# Patient Record
Sex: Male | Born: 1955 | Race: Black or African American | Hispanic: No | Marital: Single | Smoking: Former smoker
Health system: Southern US, Community
[De-identification: ages and names within clinical notes are randomized; demographics above are authoritative.]

## PROBLEM LIST (undated history)

## (undated) ENCOUNTER — Emergency Department (HOSPITAL_COMMUNITY): Discharge status: Absent without leave | Payer: Medicare Other

## (undated) DIAGNOSIS — C229 Malignant neoplasm of liver, not specified as primary or secondary: Secondary | ICD-10-CM

## (undated) DIAGNOSIS — I10 Essential (primary) hypertension: Secondary | ICD-10-CM

## (undated) DIAGNOSIS — C801 Malignant (primary) neoplasm, unspecified: Secondary | ICD-10-CM

## (undated) DIAGNOSIS — IMO0002 Reserved for concepts with insufficient information to code with codable children: Secondary | ICD-10-CM

## (undated) DIAGNOSIS — E785 Hyperlipidemia, unspecified: Secondary | ICD-10-CM

## (undated) DIAGNOSIS — C099 Malignant neoplasm of tonsil, unspecified: Secondary | ICD-10-CM

## (undated) HISTORY — PX: OTHER SURGICAL HISTORY: SHX169

## (undated) HISTORY — PX: CHOLECYSTECTOMY: SHX55

---

## 2000-08-03 ENCOUNTER — Encounter: Payer: Self-pay | Admitting: Internal Medicine

## 2000-08-03 ENCOUNTER — Ambulatory Visit (HOSPITAL_COMMUNITY): Admission: RE | Admit: 2000-08-03 | Discharge: 2000-08-03 | Payer: Self-pay | Admitting: Internal Medicine

## 2000-12-05 ENCOUNTER — Emergency Department (HOSPITAL_COMMUNITY): Admission: EM | Admit: 2000-12-05 | Discharge: 2000-12-05 | Payer: Self-pay | Admitting: Emergency Medicine

## 2001-05-30 ENCOUNTER — Ambulatory Visit (HOSPITAL_COMMUNITY): Admission: RE | Admit: 2001-05-30 | Discharge: 2001-05-30 | Payer: Self-pay | Admitting: Internal Medicine

## 2001-05-30 ENCOUNTER — Encounter: Payer: Self-pay | Admitting: Internal Medicine

## 2001-07-06 ENCOUNTER — Ambulatory Visit (HOSPITAL_COMMUNITY): Admission: RE | Admit: 2001-07-06 | Discharge: 2001-07-06 | Payer: Self-pay | Admitting: Internal Medicine

## 2001-07-06 ENCOUNTER — Encounter: Payer: Self-pay | Admitting: Internal Medicine

## 2001-09-30 ENCOUNTER — Emergency Department (HOSPITAL_COMMUNITY): Admission: EM | Admit: 2001-09-30 | Discharge: 2001-09-30 | Payer: Self-pay | Admitting: *Deleted

## 2001-12-16 ENCOUNTER — Emergency Department (HOSPITAL_COMMUNITY): Admission: EM | Admit: 2001-12-16 | Discharge: 2001-12-16 | Payer: Self-pay | Admitting: Emergency Medicine

## 2001-12-17 ENCOUNTER — Inpatient Hospital Stay (HOSPITAL_COMMUNITY): Admission: EM | Admit: 2001-12-17 | Discharge: 2001-12-22 | Payer: Self-pay | Admitting: Emergency Medicine

## 2001-12-17 ENCOUNTER — Encounter: Payer: Self-pay | Admitting: Emergency Medicine

## 2002-11-08 ENCOUNTER — Emergency Department (HOSPITAL_COMMUNITY): Admission: EM | Admit: 2002-11-08 | Discharge: 2002-11-08 | Payer: Self-pay | Admitting: Emergency Medicine

## 2002-11-08 ENCOUNTER — Encounter: Payer: Self-pay | Admitting: Emergency Medicine

## 2003-01-08 ENCOUNTER — Ambulatory Visit (HOSPITAL_COMMUNITY): Admission: RE | Admit: 2003-01-08 | Discharge: 2003-01-08 | Payer: Self-pay | Admitting: Internal Medicine

## 2003-02-26 ENCOUNTER — Ambulatory Visit (HOSPITAL_COMMUNITY): Admission: RE | Admit: 2003-02-26 | Discharge: 2003-02-26 | Payer: Self-pay | Admitting: Family Medicine

## 2003-07-07 ENCOUNTER — Emergency Department (HOSPITAL_COMMUNITY): Admission: EM | Admit: 2003-07-07 | Discharge: 2003-07-07 | Payer: Self-pay | Admitting: Emergency Medicine

## 2003-07-30 ENCOUNTER — Emergency Department (HOSPITAL_COMMUNITY): Admission: EM | Admit: 2003-07-30 | Discharge: 2003-07-30 | Payer: Self-pay | Admitting: Emergency Medicine

## 2003-08-12 ENCOUNTER — Emergency Department (HOSPITAL_COMMUNITY): Admission: EM | Admit: 2003-08-12 | Discharge: 2003-08-12 | Payer: Self-pay | Admitting: Emergency Medicine

## 2004-07-29 ENCOUNTER — Ambulatory Visit (HOSPITAL_COMMUNITY): Admission: RE | Admit: 2004-07-29 | Discharge: 2004-07-29 | Payer: Self-pay | Admitting: General Surgery

## 2004-08-05 ENCOUNTER — Encounter (INDEPENDENT_AMBULATORY_CARE_PROVIDER_SITE_OTHER): Payer: Self-pay | Admitting: General Surgery

## 2004-08-05 ENCOUNTER — Ambulatory Visit (HOSPITAL_COMMUNITY): Admission: RE | Admit: 2004-08-05 | Discharge: 2004-08-05 | Payer: Self-pay | Admitting: General Surgery

## 2004-12-20 ENCOUNTER — Emergency Department (HOSPITAL_COMMUNITY): Admission: EM | Admit: 2004-12-20 | Discharge: 2004-12-20 | Payer: Self-pay | Admitting: Emergency Medicine

## 2005-04-11 ENCOUNTER — Emergency Department (HOSPITAL_COMMUNITY): Admission: EM | Admit: 2005-04-11 | Discharge: 2005-04-11 | Payer: Self-pay | Admitting: Emergency Medicine

## 2005-10-26 ENCOUNTER — Emergency Department (HOSPITAL_COMMUNITY): Admission: EM | Admit: 2005-10-26 | Discharge: 2005-10-26 | Payer: Self-pay | Admitting: Emergency Medicine

## 2005-12-22 ENCOUNTER — Emergency Department (HOSPITAL_COMMUNITY): Admission: EM | Admit: 2005-12-22 | Discharge: 2005-12-22 | Payer: Self-pay | Admitting: Emergency Medicine

## 2006-01-03 ENCOUNTER — Ambulatory Visit (HOSPITAL_COMMUNITY): Admission: RE | Admit: 2006-01-03 | Discharge: 2006-01-03 | Payer: Self-pay | Admitting: Internal Medicine

## 2006-01-05 ENCOUNTER — Emergency Department (HOSPITAL_COMMUNITY): Admission: EM | Admit: 2006-01-05 | Discharge: 2006-01-05 | Payer: Self-pay | Admitting: Emergency Medicine

## 2006-01-17 ENCOUNTER — Encounter (INDEPENDENT_AMBULATORY_CARE_PROVIDER_SITE_OTHER): Payer: Self-pay | Admitting: Specialist

## 2006-01-17 ENCOUNTER — Ambulatory Visit (HOSPITAL_COMMUNITY): Admission: RE | Admit: 2006-01-17 | Discharge: 2006-01-17 | Payer: Self-pay | Admitting: General Surgery

## 2006-03-26 ENCOUNTER — Emergency Department (HOSPITAL_COMMUNITY): Admission: EM | Admit: 2006-03-26 | Discharge: 2006-03-26 | Payer: Self-pay | Admitting: Emergency Medicine

## 2007-01-19 DIAGNOSIS — IMO0001 Reserved for inherently not codable concepts without codable children: Secondary | ICD-10-CM

## 2007-01-19 HISTORY — DX: Reserved for inherently not codable concepts without codable children: IMO0001

## 2007-02-25 ENCOUNTER — Emergency Department (HOSPITAL_COMMUNITY): Admission: EM | Admit: 2007-02-25 | Discharge: 2007-02-25 | Payer: Self-pay | Admitting: Emergency Medicine

## 2007-03-29 ENCOUNTER — Ambulatory Visit (HOSPITAL_COMMUNITY): Admission: RE | Admit: 2007-03-29 | Discharge: 2007-03-29 | Payer: Self-pay | Admitting: Otolaryngology

## 2007-03-30 ENCOUNTER — Ambulatory Visit (HOSPITAL_COMMUNITY): Admission: RE | Admit: 2007-03-30 | Discharge: 2007-03-30 | Payer: Self-pay | Admitting: Otolaryngology

## 2007-04-06 ENCOUNTER — Encounter (INDEPENDENT_AMBULATORY_CARE_PROVIDER_SITE_OTHER): Payer: Self-pay | Admitting: Otolaryngology

## 2007-04-06 ENCOUNTER — Ambulatory Visit (HOSPITAL_COMMUNITY): Admission: RE | Admit: 2007-04-06 | Discharge: 2007-04-06 | Payer: Self-pay | Admitting: Otolaryngology

## 2007-04-27 ENCOUNTER — Ambulatory Visit (HOSPITAL_COMMUNITY): Admission: RE | Admit: 2007-04-27 | Discharge: 2007-04-27 | Payer: Self-pay | Admitting: Internal Medicine

## 2007-05-03 ENCOUNTER — Encounter: Admission: AD | Admit: 2007-05-03 | Discharge: 2007-05-03 | Payer: Self-pay | Admitting: Dentistry

## 2007-05-03 ENCOUNTER — Ambulatory Visit: Payer: Self-pay | Admitting: Dentistry

## 2007-05-04 ENCOUNTER — Ambulatory Visit: Admission: RE | Admit: 2007-05-04 | Discharge: 2007-08-02 | Payer: Self-pay | Admitting: Radiation Oncology

## 2007-05-12 ENCOUNTER — Ambulatory Visit (HOSPITAL_COMMUNITY): Admission: RE | Admit: 2007-05-12 | Discharge: 2007-05-12 | Payer: Self-pay | Admitting: General Surgery

## 2007-06-17 ENCOUNTER — Emergency Department (HOSPITAL_COMMUNITY): Admission: EM | Admit: 2007-06-17 | Discharge: 2007-06-17 | Payer: Self-pay | Admitting: Emergency Medicine

## 2007-07-15 ENCOUNTER — Inpatient Hospital Stay (HOSPITAL_COMMUNITY): Admission: EM | Admit: 2007-07-15 | Discharge: 2007-08-01 | Payer: Self-pay | Admitting: Emergency Medicine

## 2007-07-21 ENCOUNTER — Ambulatory Visit: Payer: Self-pay | Admitting: Oncology

## 2007-08-01 ENCOUNTER — Ambulatory Visit (HOSPITAL_COMMUNITY): Payer: Self-pay | Admitting: Oncology

## 2007-10-16 ENCOUNTER — Ambulatory Visit (HOSPITAL_COMMUNITY): Admission: RE | Admit: 2007-10-16 | Discharge: 2007-10-16 | Payer: Self-pay | Admitting: Radiation Oncology

## 2008-05-17 ENCOUNTER — Ambulatory Visit (HOSPITAL_COMMUNITY): Admission: RE | Admit: 2008-05-17 | Discharge: 2008-05-17 | Payer: Self-pay | Admitting: Internal Medicine

## 2008-05-27 ENCOUNTER — Ambulatory Visit (HOSPITAL_COMMUNITY): Admission: RE | Admit: 2008-05-27 | Discharge: 2008-05-27 | Payer: Self-pay | Admitting: Internal Medicine

## 2008-07-17 ENCOUNTER — Ambulatory Visit (HOSPITAL_COMMUNITY): Admission: RE | Admit: 2008-07-17 | Discharge: 2008-07-17 | Payer: Self-pay | Admitting: Internal Medicine

## 2009-12-25 ENCOUNTER — Emergency Department (HOSPITAL_COMMUNITY): Admission: EM | Admit: 2009-12-25 | Discharge: 2009-11-02 | Payer: Self-pay | Admitting: Emergency Medicine

## 2010-01-22 ENCOUNTER — Ambulatory Visit (HOSPITAL_COMMUNITY)
Admission: RE | Admit: 2010-01-22 | Discharge: 2010-01-22 | Payer: Self-pay | Source: Home / Self Care | Attending: Otolaryngology | Admitting: Otolaryngology

## 2010-02-09 ENCOUNTER — Encounter (HOSPITAL_COMMUNITY): Payer: Self-pay | Admitting: Internal Medicine

## 2010-02-14 IMAGING — CR DG ABDOMEN 1V
2 series · 2 of 2 positions shown · non-contrast
Comparison: 11/08/2002

CLINICAL DATA: Constipation, abdominal pain

ABDOMEN - 1 VIEW

[view not recorded (1 of 2)]
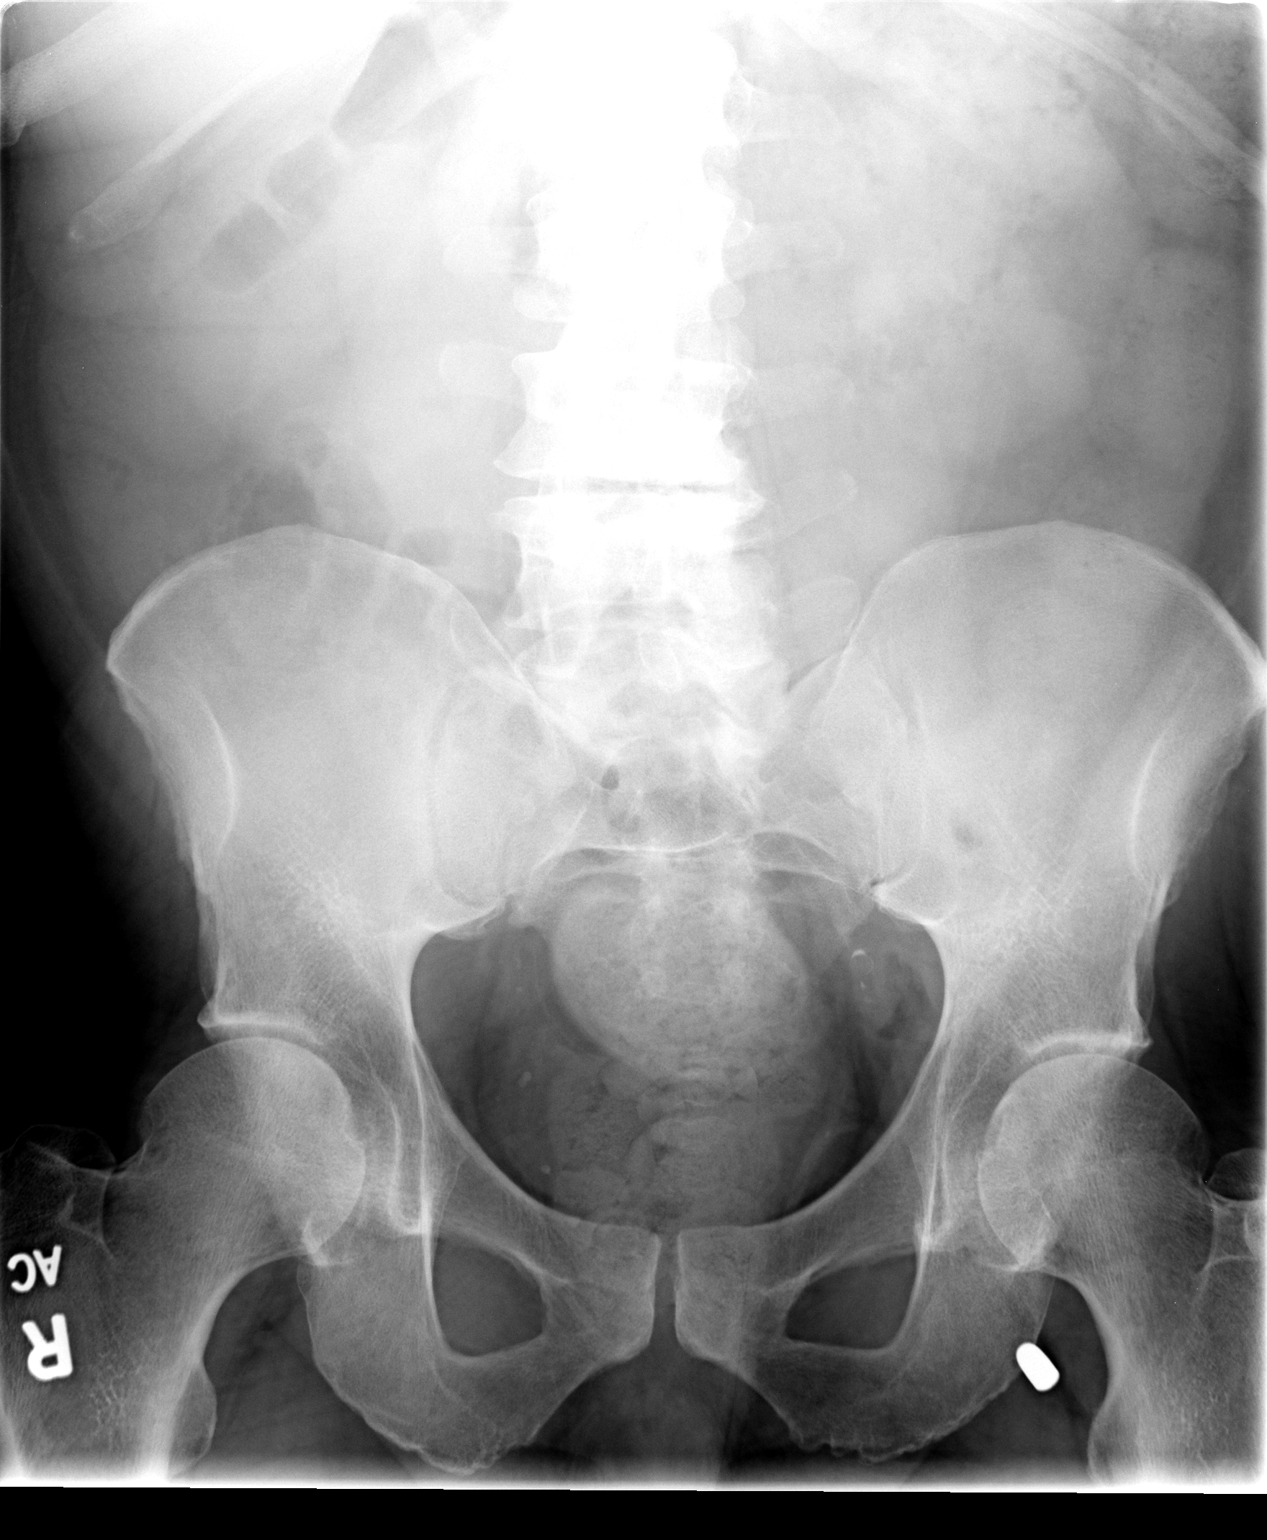

[view not recorded (2 of 2)]
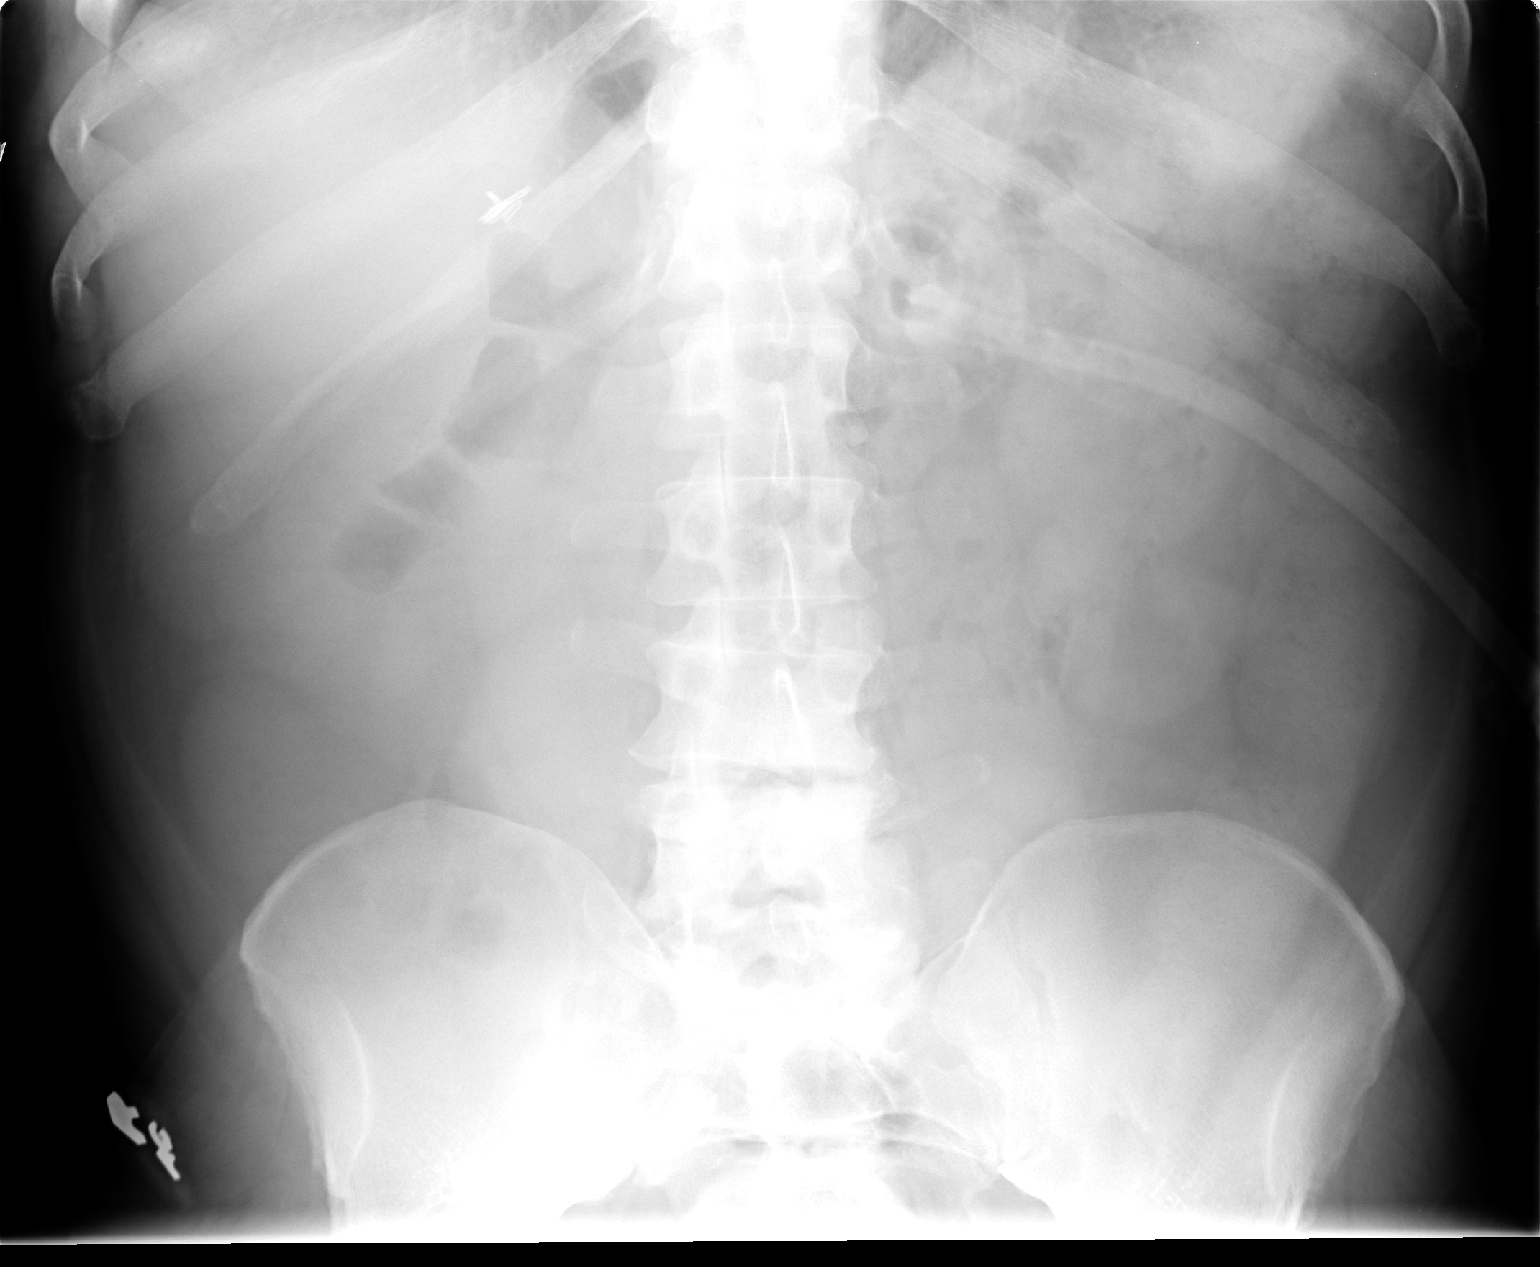

[2 of 2 positions shown; findings below may reference images not displayed]

FINDINGS: Gastrostomy tube is in place.  The patient is status post
cholecystectomy.  There is a nonobstructive bowel gas pattern.
Moderate stool throughout the colon.  No supine evidence of free
air, organomegaly, or suspicious calcification.
IMPRESSION: Moderate stool burden.  No obstruction or acute finding.

Prior cholecystectomy.

## 2010-02-27 NOTE — H&P (Signed)
  NAMECYLE, [name]              ACCOUNT NO.:  [id number]  MEDICAL RECORD NO.:  [id number]          PATIENT TYPE:  OUT  LOCATION:  RAD                           FACILITY:  [facility]  PHYSICIAN:  Dalia Heading, M.D.  DATE OF BIRTH:  08/10/1955  DATE OF ADMISSION:  01/22/2010 DATE OF DISCHARGE:  01/05/2012LH                             HISTORY & PHYSICAL   CHIEF COMPLAINT:  Tonsillar carcinoma, finished with chemotherapy.  HISTORY OF PRESENT ILLNESS:  The patient is a 55 year old black male who is referred for Port-A-Cath removal.  He was being treated for tonsillar carcinoma and has finished with his chemotherapy.  PAST MEDICAL HISTORY:  As noted above, COPD.  PAST SURGICAL HISTORY:  Port-A-Cath placement in 2009, laparoscopic cholecystectomy.  CURRENT MEDICATIONS:  Spiriva inhaler p.r.n.  ALLERGIES:  No known drug allergies.  REVIEW OF SYSTEMS:  The patient does have a history of tobacco and alcohol use.  Denies any recent chest pain, MI, CVA, diabetes mellitus, or bleeding disorders.  FAMILY MEDICAL HISTORY:  Noncontributory.  PHYSICAL EXAMINATION:  GENERAL:  The patient is a well-developed, well- nourished black male, in no acute distress. HEENT:  Unremarkable. LUNGS:  Clear to auscultation with equal breath sounds bilaterally.  A Port-A-Cath is in place in the left upper chest. HEART:  Regular rate and rhythm without S3, S4, or murmurs.  IMPRESSION:  Tonsillar carcinoma, finished with chemotherapy.  PLAN:  The patient is scheduled for a Port-A-Cath removal on March 02, 2010.  The risks and benefits of the procedure were fully explained to the patient, gave informed consent.     Dalia Heading, M.D.     MAJ/MEDQ  D:  02/24/2010  T:  02/25/2010  Job:  [id number]  cc:   Tesfaye D. Felecia Shelling, MD Fax: [phone]  Boris M. Darovsky, M.D. Fax: [phone]  Electronically Signed by Franky Macho M.D. on 02/27/2010 01:24:25 PM

## 2010-03-02 ENCOUNTER — Ambulatory Visit (HOSPITAL_COMMUNITY)
Admission: RE | Admit: 2010-03-02 | Discharge: 2010-03-02 | Disposition: A | Discharge status: Home / Self Care | Payer: Medicare Other | Source: Ambulatory Visit | Attending: General Surgery | Admitting: General Surgery

## 2010-03-02 DIAGNOSIS — C099 Malignant neoplasm of tonsil, unspecified: Secondary | ICD-10-CM | POA: Insufficient documentation

## 2010-03-02 DIAGNOSIS — Z452 Encounter for adjustment and management of vascular access device: Secondary | ICD-10-CM | POA: Insufficient documentation

## 2010-03-05 NOTE — Op Note (Signed)
  [name], [name]              ACCOUNT NO.:  [id number]  MEDICAL RECORD NO.:  [id number]           PATIENT TYPE:  O  LOCATION:  DAYP                          FACILITY:  [facility]  PHYSICIAN:  Dalia Heading, M.D.  DATE OF BIRTH:  May 15, 1955  DATE OF PROCEDURE:  03/02/2010 DATE OF DISCHARGE:                              OPERATIVE REPORT   PREOPERATIVE DIAGNOSIS:  Tonsillar carcinoma, finished with chemotherapy.  POSTOPERATIVE DIAGNOSIS:  Tonsillar carcinoma, finished with chemotherapy.  PROCEDURE:  Port-A-Cath removal.  SURGEON:  Dalia Heading, MD  ANESTHESIA:  Local.  INDICATIONS:  The patient is a 56 year old black male who underwent chemotherapy for tonsillar carcinoma.  The Port-A-Cath has been placed for about 2 years and they are finished.  The patient now comes to the minor procedure room for Port-A-Cath removal.  The risks and benefits of the procedure including bleeding and infection were fully explained to the patient, gave informed consent.  PROCEDURE NOTE:  The patient was placed in the supine position.  The left upper chest was prepped and draped using the usual sterile technique with DuraPrep.  Surgical site confirmation was performed.  A 1% Xylocaine was used for local anesthesia.  An incision was made through the patient's previous surgical scar.  The dissection was taken down to the port.  The port was removed without difficulty.  It was disposed.  No abnormal bleeding was noted.  The subcutaneous layer was reapproximated using a 4-0 Vicryl interrupted suture.  The skin was closed using a 4-0 Vicryl subcuticular suture. OcuSeal was then applied.  All tape and needle counts were correct at the end of the procedure. The patient was discharged in stable condition.  COMPLICATIONS:  None.  SPECIMEN:  None.  ESTIMATED BLOOD LOSS:  Minimal.     Dalia Heading, M.D.     MAJ/MEDQ  D:  03/02/2010  T:  03/03/2010  Job:  [id number]  cc:   Lebron Conners.  Darovsky, M.D. Fax: [phone]  Electronically Signed by Franky Macho M.D. on 03/05/2010 12:37:50 PM

## 2010-04-27 LAB — GLUCOSE, CAPILLARY: Glucose-Capillary: 117 mg/dL — ABNORMAL HIGH (ref 70–99)

## 2010-06-02 NOTE — Op Note (Signed)
NAMESALVADOR, [name]              ACCOUNT NO.:  [id number]   MEDICAL RECORD NO.:  [id number]          PATIENT TYPE:  AMB   LOCATION:  SDS                          FACILITY:  [facility]   PHYSICIAN:  [name], MD     DATE OF BIRTH:  1956/01/03   DATE OF PROCEDURE:  04/06/2007  DATE OF DISCHARGE:                               OPERATIVE REPORT   REFERRING PHYSICIAN:  [name], MD.   PREOP DIAGNOSIS:  Right pharyngeal mass.   POSTOP DIAGNOSIS:  Right pharyngeal mass.   PROCEDURE:  Direct laryngoscopy with biopsy of right pharyngeal mass and  esophagoscopy.   SURGEON:  Pollyann Kennedy.   General endotracheal anesthesia was used.  No complications.   FINDINGS:  Exophytic mass involving the right posterior tonsillar fossa  measuring approximately 4-5 cm in diameter, not involving the soft  palate or the nasopharynx and not involving the hypopharynx.  Biopsies  were sent for pathological evaluation.  No other abnormalities were  identified in the larynx or the esophagus.   HISTORY:  This is a 55 year old with a several month history of right-  sided sore throat.  Risks, benefits, alternatives, complications to the  procedure were explained to the patient who seemed to understand and  agreed to surgery.   PROCEDURE:  The patient was taken to the operating room and placed on  the operating table in the supine position.  Following induction of  general endotracheal anesthesia the table was turned and the patient was  draped in a standard fashion.  1. Esophagoscopy.  A rigid cervical esophagoscope was used to examine      the cervical esophagus.  It was passed through the cricopharyngeus      into the esophagus to the limits of the hub and was slowly      withdrawn while circumferentially inspecting the esophageal wall      and suctioning secretions.  No lesions were identified.  The scope      was then removed.  2. Direct laryngoscopy with biopsy.  The anterior commissure of the  laryngoscope was used to examine the hypopharynx and larynx.  The      complete laryngeal and hypopharyngeal exam was unremarkable.  The      above-      mentioned findings were noted in the oropharynx.  Multiple biopsies      were taken and sent for pathological evaluation.  A 4 x 4 with      saline was applied topically to provide hemostasis.  The patient      was then awakened, extubated and transferred to Recovery in stable      condition.      [name], MD  Electronically Signed     JHR/MEDQ  D:  04/06/2007  T:  04/06/2007  Job:  [id number]   cc:   Tesfaye D. Felecia Shelling, MD

## 2010-06-02 NOTE — Discharge Summary (Signed)
[name], [name]              ACCOUNT NO.:  [id number]   MEDICAL RECORD NO.:  [id number]          PATIENT TYPE:  INP   LOCATION:  A304                          FACILITY:  [facility]   PHYSICIAN:  Tesfaye D. Felecia Shelling, MD   DATE OF BIRTH:  1955/04/10   DATE OF ADMISSION:  07/15/2007  DATE OF DISCHARGE:  07/14/2009LH                               DISCHARGE SUMMARY   DISCHARGE DIAGNOSES:  1. Aspiration pneumonia.  2. Chronic obstructive pulmonary disease.  3. Dehydration.  4. Protein calorie malnutrition.  5. History of carcinoma of the tonsil and status post radiation and      chemotherapy.  6. Pancytopenia.  7. History of peripheral neuropathy.  8. History of tobacco abuse.  9. Osteoarthritis.  10.Status post percutaneous endoscopic gastrostomy tube placement.   DISCHARGE MEDICATIONS:  1. Folic acid 1 mg daily.  2. Lortab 5/500 1 tablet q.6 h.  3. Neurontin 300 mg t.i.d.  4. Omeprazole 40 mg daily.  5. Spiriva inhaler daily.   DISPOSITION:  The patient was discharged home.   HOSPITAL COURSE:  This is a 54 year old male patient with history of  chronic obstructive pulmonary disease and CA of the tonsil, brought to  emergency room with generalized weakness and shortness of breath.  The  patient was recently started on chemotherapy and radiation.  He was  having difficulty to swallow.  He underwent PEG placement.  However, he  was trying to swallow some.  During the evaluation in the emergency  room, patient was found to have a sign of dehydration and a possible  aspiration pneumonia.  He was started on IV antibiotics and the IV  fluids.  He was admitted and continued on tube feeding.  While the  patient was in the hospital, he started  developing pancytopenia.  Oncology consult was done and the patient was  closely monitored.  He was on neutropenic precaution.  Later, the  patient was given a dose of Neulasta to stimulate his bone marrow.  The  patient became stable and was  discharged to follow his treatment in  outpatient.      Tesfaye D. Felecia Shelling, MD  Electronically Signed     TDF/MEDQ  D:  08/14/2007  T:  08/14/2007  Job:  [id number]

## 2010-06-02 NOTE — Op Note (Signed)
[name], [name]              ACCOUNT NO.:  [id number]   MEDICAL RECORD NO.:  [id number]          PATIENT TYPE:  OUT   LOCATION:                                 FACILITY:   PHYSICIAN:  Charlynne Pander, D.D.S.DATE OF BIRTH:  Jun 01, 1955   DATE OF PROCEDURE:  05/03/2007  DATE OF DISCHARGE:                               OPERATIVE REPORT   PREOPERATIVE DIAGNOSES:  1. 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  2. Prechemoradiation therapy dental protocol.  3. Retained root tip in the area of tooth #32.   POSTOPERATIVE DIAGNOSES:  1. 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  2. Prechemoradiation therapy dental protocol.  3. Retained root tip in the area of tooth #32.   OPERATION:  Surgical extraction of retained root tip in the area of  tooth #32 with alveoloplasty.   SURGEON:  Charlynne Pander, D.D.S.   ASSISTANT:  Public house manager (Sales executive).   ANESTHESIA:  Local anesthesia only.   MEDICATION.:  Local anesthesia with total utilization of 1 carpule  containing 36 mg of Xylocaine with 0.018 mg of epinephrine.   SPECIMENS:  There was 1 retained root tip from tooth area #32.   DRAINS:  None.   CULTURES:  None.   COMPLICATIONS:  None.   ESTIMATED BLOOD LOSS:  Less than 10 mL.   FLUIDS:  None.   INDICATIONS:  The patient was recently diagnosed with squamous cell  carcinoma of the right oropharynx/tonsillar fossa.  Patient with  anticipated chemoradiation therapy.  The patient was seen as part of a  preradiation therapy dental protocol evaluation.  The patient was  examined and treatment planned for extraction of retained root tip in  the area of tooth #32 with alveoloplasty as indicated.  This treatment  plan was formulated to decrease the risk and complications associated  with dental infection from affecting the patient's systemic health as  well as to prevent future complication of osteoradionecrosis.   OPERATIVE FINDINGS:  The patient was examined in the dental medicine  clinic.  Retained root tip #32 was identified for extraction.  The  patient is noted to be affected by the retained root tip as mentioned.  This necessitated removal of the retained root tip with alveoloplasty as  indicated.   DESCRIPTION OF PROCEDURE:  The patient presented to the dental medicine  clinic.  All risks, benefits and complications were explained to the  patient and an informed consent was attained.  The patient was then  prepped and draped in the usual manner for a dental medicine procedure.  The patient was then identified and procedures verified.  At this point  in time local anesthesia was administered with a total utilization of 36  mg of Xylocaine with 0.018 mg of epinephrine.   The mandibular right quadrant was first approached, a 15-blade incision  was made from the distal of #32 and extended to the area of #29.  A  surgical flap was then carefully reflected.  The retained root in the  area of #32 was then identified and removed with the rongeur.  The  area  was further curetted with a Public house manager.  A periapical radiograph  was then obtained and no further root tip was noted.  Further  alveoloplasty was then performed utilizing rongeurs and bone file.  The  surgical site was then irrigated with copious amounts of sterile saline.  Tissues were approximated and trimmed appropriately.  The surgical site  was then closed from the distal of #32 and extended to the mesial of #29  utilizing 3-0 chromic gut suture in a continuous interrupted suture  technique x1.  A series of 4x4 gauze were placed in the mouth to aid  hemostasis.  The patient was then placed in an upright position.  Postoperative blood pressure was obtained with the findings of 129/64  with a pulse of 69.  The patient was then given postop instructions  written and verbally.  The patient does have pain medication to use at  home.  The  patient will return in approximately 1 week for evaluation  for suture removal.  The patient to follow up with Dr. Antony Blackbird for  simulation and postoperative radiation therapy to commence approximately  2 weeks from this date.  All counts were correct for the dental medicine  procedure.  The patient was then dismissed to the care of his brother in  a stable condition.      Charlynne Pander, D.D.S.  Electronically Signed     RFK/MEDQ  D:  05/03/2007  T:  05/03/2007  Job:  [id number]   cc:   Billie Lade, M.D.  Fax: [phone]   Boris M. Darovsky, M.D.  Fax: [phone]   Charlynne Pander, D.D.S.  Fax: [phone]

## 2010-06-02 NOTE — Op Note (Signed)
NAMETYREE, FLUHARTY              ACCOUNT NO.:  [id number]   MEDICAL RECORD NO.:  [id number]          PATIENT TYPE:  AMB   LOCATION:  DAY                           FACILITY:  [facility]   PHYSICIAN:  Dalia Heading, M.D.  DATE OF BIRTH:  22-Jul-1955   DATE OF PROCEDURE:  05/12/2007  DATE OF DISCHARGE:                               OPERATIVE REPORT   PREOPERATIVE DIAGNOSIS:  Tonsillar carcinoma, need for central venous  access.   POSTOPERATIVE DIAGNOSIS:  Tonsillar carcinoma, need for central venous  access.   PROCEDURE:  Percutaneous endoscopic gastrostomy tube, Port-A-Cath  insertion.   SURGEON:  Dalia Heading, MD   ANESTHESIA:  General endotracheal.   INDICATIONS:  The patient is a 55 year old black male recently diagnosed  with tonsillar carcinoma who is about to undergo radiation and  chemotherapy.  He will need a PEG for dysphagia as well as a Port-A-Cath  for chemotherapy.  The risks and benefits of both procedures including  bleeding, infection, bowel injury, and the possibility of a pneumothorax  were fully explained to the patient, he gave informed consent.   PROCEDURE NOTE:  The patient was placed in supine position.  After  induction of general endotracheal anesthesia, the abdomen was prepped  and draped using the usual sterile technique with Betadine.  Surgical  site confirmation was performed.   An EGD was performed down to the pylorus.  The pylorus was noted to be  widely patent.  A site in the epigastric region was transilluminated and  palpated.  A small incision was made and the needle was advanced into  the stomach under direct visualization without difficulty.  Guidewire  was then advanced into the stomach and grasped with a snare.  The  endoscope was then removed with the wire.  A gastrostomy tube was  attached to the wire and using the pull technique, the gastrostomy tube  was pulled through the abdominal wall and the bulb was fixated to the  lumen of the  stomach.  The bolster was placed at the 3 cm mark without  difficulty.  The endoscope was advanced back down into the stomach and  the bulb was noted to be in appropriate position.  All air was then  evacuated from the stomach prior to removal of the endoscope.  Bacitracin ointment and dry sterile dressing was then applied to the PEG  site.   Next, we proceeded with a Port-A-Cath.  The left upper chest was prepped  and draped using the usual sterile technique with Betadine.  Surgical  site confirmation was performed.  A transverse incision was made below  the left clavicle.  The subcutaneous pocket was then formed.  A needle  was advanced into the left subclavian vein using the Seldinger technique  without difficulty.  Guidewire was then advanced into the right atrium  under fluoroscopic guidance.  An introducer and peel-away sheath were  placed over the guidewire.  The catheter was inserted through the peel-  away sheath and the peel-away sheath was removed.  The catheter was  attached to the port, the port placed in subcutaneous  pocket.  Adequate  positioning was confirmed by fluoroscopy.  The port was flushed with  3000 units of heparin.  The subcutaneous layer was re-approximated using  a 3-0 Vicryl interrupted suture.  The skin was closed using a 4-0 Vicryl  subcuticular suture.  Lidocaine was instilled in the surrounding region.  Dermabond was then applied.   All tape and needle counts were correct at the end of both procedures.  The patient was extubated in the operating room, went back to recovery  room, and awake in stable condition.   COMPLICATIONS:  None.   SPECIMENS:  None.   ESTIMATED BLOOD LOSS:  Minimal.      Dalia Heading, M.D.  Electronically Signed     MAJ/MEDQ  D:  05/12/2007  T:  05/13/2007  Job:  [id number]   cc:   Tesfaye D. Felecia Shelling, MD  Fax: [phone]   Shawnie Dapper, M.D.  Fax: [phone]   Boris M. Darovsky, M.D.  Fax: [phone]

## 2010-06-02 NOTE — H&P (Signed)
[name], [name]              ACCOUNT NO.:  [id number]   MEDICAL RECORD NO.:  [id number]          PATIENT TYPE:  AMB   LOCATION:  DAY                           FACILITY:  [facility]   PHYSICIAN:  Dalia Heading, M.D.  DATE OF BIRTH:  1955/04/07   DATE OF ADMISSION:  DATE OF DISCHARGE:  LH                              HISTORY & PHYSICAL   CHIEF COMPLAINT:  Tonsillar carcinoma.   HISTORY OF PRESENT ILLNESS:  The patient is a 55 year old black male who  is referred for a PEG placement as well as Port-A-Cath placement.  He  has tonsillar carcinoma and is about to undergo radiation and  chemotherapy treatments.  It is suspected that he will have difficulty  with eating once his radiation starts to the neck in tonsillar region.   PAST MEDICAL HISTORY:  Includes COPD.   PAST SURGICAL HISTORY:  Laparoscopic cholecystectomy.   CURRENT MEDICATIONS:  Spiriva inhaler and hydrocodone for pain.   ALLERGIES:  No known drug allergies.   REVIEW OF SYSTEMS:  The patient does drink daily as well as smoke daily.  Denies any recent MI, chest pain, CVA, diabetes mellitus, or bleeding  disorders.   PHYSICAL EXAMINATION:  GENERAL:  The patient is a well-developed, well-  nourished black male in no acute distress.  HEENT:  No obvious obstruction of the oropharynx.  LUNGS:  Clear to auscultation with equal breath sounds bilaterally.  HEART:  Regular rate and rhythm without S3, S4, murmurs.  ABDOMEN:  Soft, nontender, and nondistended.  No hepatosplenomegaly,  masses, or hernias identified.   IMPRESSION:  Tonsillar carcinoma.   PLAN:  The patient is scheduled for PEG and Port-A-Cath insertions on  May 12, 2007.  The risks and benefits of the procedures including  bleeding, infection, bowel injury, and the possibility of a pneumothorax  were fully explained to the patient, gained informed consent.      Dalia Heading, M.D.  Electronically Signed     MAJ/MEDQ  D:  05/09/2007  T:   05/10/2007  Job:  [id number]   cc:   Tesfaye D. Felecia Shelling, MD  Fax: [phone]   Shawnie Dapper, M.D.  Fax: [phone]   Boris M. Darovsky, M.D.  Fax: [phone]

## 2010-06-02 NOTE — Consult Note (Signed)
[name], [name]              ACCOUNT NO.:  [id number]   MEDICAL RECORD NO.:  [id number]          PATIENT TYPE:  INP   LOCATION:  A304                          FACILITY:  [facility]   PHYSICIAN:  Albert Horns. Neijstrom, MD  DATE OF BIRTH:  [birth year]   DATE OF CONSULTATION:  07/15/2007  DATE OF DISCHARGE:                                 CONSULTATION   DIAGNOSES:  1. Weakness.  2. Shortness of breath.  3. At times difficulty swallowing.  4. Aching in his legs.  5. Leukopenia.  6. Anemia.  7. Head and neck cancer status post radiation and chemotherapy just      have not finished yet 1 week ago today he states.  8. Chronic obstructive pulmonary disease.  9. History of excessive alcohol use in the past.  10.History of peripheral neuropathy most likely causing his symptoms      as mentioned above.  11.Osteoarthritis.   HISTORY:  This is a 51-year African American gentleman admitted with  weakness, fatigue as mentioned above by [name].  He lives at home  alone.  He is not married.  He has no children he states.  He smoked and  drank excessively.  He stopped smoking about 3-4 months ago he states.  Stopped drinking and he thinks sometime within the last 3-6 months.  He  is unsure is to the dates.   He went to see [name] the day due to this amount of weakness and  fatigue.  He was found to have on July 15, 2007, a white count of 16,400  with a left shift.  His hemoglobin on that day was 12.0, platelets  121,000.  A repeat blood counts in next day showed his white count  dropped to 5800, hemoglobin was down to 10.9, platelets 107,000;  however, he had been hydrated.  His lipase on admission was normal.  His  C-MET on admission showed a BUN of 13, creatinine of 0.74.  His albumin  in the resting was 3.4, total protein 7.3, and calcium was normal.   BMET on the following day of admission was showing a BUN of 7,  creatinine 0.63, other electrolytes were fine.  By July 19, 2007, his  white count was down to 2600 with an absolute neutrophil count of only  1500, hemoglobin was 11 and platelets 128,000.  He had a CBC today,  which shows a white count of 2300, again neutrophil count of 1500,  platelets 131,000, and hemoglobin 11.1, so he may indeed be recovering  at this juncture.  Two blood cultures from the July 15, 2007, showed no  growth.  Urine culture shows higher contaminant.   His urinalysis on the July 15, 2007, showed multiple bacteria, but his  urine, leukocytes, and nitrite tests were negative.   He had chest x-ray as low as of some abdominal films on admission, which  showed no acute abnormalities.   The only abnormality seen with some moderate amount of stool in his  colon and prior changes of cholecystectomy.  He does remember Dr.  Lovell [name] did that procedure several years ago.  He did have a PET scan at the time of workup on April 27, 2007, which did  show positive nodes in the neck on the right side, and there was a  questionable node in the supraclavicular fossa as well.  The pharynx  mass was also very hypermetabolic at that time.  The left submandibular  gland also showed a slight hypermetabolic focus, which may be a small  lymph node in that area on the left and again he had right-sided disease  at presentation and right-sided level 2 nodes positive on the PET scan.   The subclavicular lymph node that was mildly hypermetabolic was on the  right.   This gentleman had very advanced cancer of the head and neck obviously  at presentation.   SOCIAL HISTORY:  He is single, never been married, he has no children.  He does have some siblings who look in on him.  His mother is 88, and  she is still alive, but has Alzheimer's he thinks.  His father is  deceased from liver failure, he believes from alcoholism.   This gentleman has an eight grade education, left school to go to work.   He has smoked for number of years, drink heavily at one time.  He  denies  any alcohol use during the radiation and chemotherapy.   PHYSICAL EXAMINATION:  VITAL SIGNS:  He has a normal temperature at this  time.  Blood pressure 124/90, respirations 16 and unlabored, pulse rate  around 72 and regular.  GENERAL:  He is not in any acute distress, but he has changes on his  right neck of desquamation and irritation and mild swelling.  HEENT:  His mouth he can open, but it is with difficulty and some  discomfort.  I cannot appreciate any intraoral lesions.  His teeth are  gone.  There were removed by [name] prior to his therapy.  He  has no obvious axillary nodes.  He has no obvious left neck nodes or  subclavicular nodes.  I did not examine the right due to his tenderness.  LUNGS:  Clear with diminished breath sounds.  HEART:  Regular rhythm and rate without obvious murmur, rub, or gallop.  ABDOMEN:  Soft and nontender.  He does have a PEG feeding tube.  He also  has a Port-A-Cath left upper chest which appears intact.  EXTREMITIES:  He has no peripheral edema, but pulses are absent in his  feet essentially.  His facial symmetry is intact.   He does look weak but again is not acutely in acute distress.   IMPRESSION AND PLAN:  This gentleman just needs more time following his  completion of chemo and radiation therapy for his advanced metastatic  disease to lymph nodes in the neck and subclavicular fossa.  He should  be improved, and he will return to see [name] upon discharge.      Albert Horns. Mariel Sleet, MD  Electronically Signed     ESN/MEDQ  D:  07/21/2007  T:  07/21/2007  Job:  [id number]   cc:   Albert D. Felecia Shelling, MD  Fax: [phone]   [name], M.D.  Fax: [phone]   [name], M.D.  Fax: [phone]

## 2010-06-02 NOTE — H&P (Signed)
NAMEANTWINE, AGOSTO              ACCOUNT NO.:  [id number]   MEDICAL RECORD NO.:  [id number]          PATIENT TYPE:  INP   LOCATION:  A304                          FACILITY:  [facility]   PHYSICIAN:  Tesfaye D. Felecia Shelling, MD   DATE OF BIRTH:  1955/02/19   DATE OF ADMISSION:  07/15/2007  DATE OF DISCHARGE:  LH                              HISTORY & PHYSICAL   CHIEF COMPLAINT:  Generalized weakness and shortness of breath.   HISTORY OF PRESENT ILLNESS:  This is a 55 year old male patient who has  history of chronic obstructive pulmonary disease and CA of the larynx  who was brought to the emergency room with the above complaints.  The  patient has been on chemotherapy and radiation therapy after he had  surgery for CA of the larynx.  The patient has been having difficulty in  swallowing.  He has a PEG tube.  However, he was trying to take oral  feeding.  The patient started having shortness of breath and generalized  weakness.  He was not taking any significant amount of oral feeding.  He  was brought to the emergency room where he was evaluated and was found  to be very weak.  The patient was admitted as a possible case of  dehydration.   REVIEW OF SYSTEMS:  The patient feels weak and tired.  He has difficulty  in swallowing and had episode of choking.  No fever or chills, chest  pain, nausea, vomiting, abdominal pain, dysuria, urgency or frequency of  urination.   PAST MEDICAL HISTORY:  1. Chronic obstructive pulmonary disease.  2. Cancer of the larynx.  3. Osteoarthritis.  4. Peripheral neuropathy.  5. History of alcohol and tobacco abuse.   CURRENT MEDICATIONS:  1. Folic acid 1 mg daily.  2. Lortab 5/500 one tablet q.6 p.r.n.  3. Neurontin 300 mg t.i.d.  4. Spiriva 1 inhaler b.i.d.  5. Ventolin inhaler q.4 h p.r.n.  6. Omeprazole 20 mg daily.   SOCIAL HISTORY:  The patient is single.  He has history of alcohol and  tobacco abuse.  No history of substance abuse.   PHYSICAL  EXAMINATION:  GENERAL:  The patient is alert, awake and  chronically sick looking.  VITAL SIGNS:  On admission blood pressure 126/77, pulse 87, respiratory  rate 18, temperature 98.6 degrees Fahrenheit.  HEENT: Pupils are equal and reactive.  NECK:  Neck is supple.  CHEST:  Poor air entry, bilateral rhonchi.  CARDIOVASCULAR:  First and second heart sound heard.  No murmur, no  gallop.  ABDOMEN:  Soft and lax.  Bowel sounds positive.  No mass or  organomegaly.  EXTREMITIES:  No leg edema.   LABS:  On admission, sodium 137, potassium 3.6, chloride 103, carbon  dioxide 26, glucose 103, BUN 13, creatinine 0.7, calcium 9.8.  CBC - WBC  16.4, hemoglobin 12.0, hematocrit 34, platelet 121.   ASSESSMENT:  This is a 55 year old male patient with history of recent  diagnosis of cancer of the larynx and is status post surgery and  chemotherapy and radiation.  The patient is obviously having difficulty  swallowing.  He was trying to take oral feeding which may have resulted  in some degree of aspiration.  The patient has a generalized weakness  secondary to poor oral intake and possibly dehydration and protein  calorie malnutrition.   PLAN:  We will start the patient on tube feeding.  Will gradually  rehydrate with IV fluids.  Will continue on nebulizer treatments.  Will  empirically treat for possible aspiration pneumonia.      Tesfaye D. Felecia Shelling, MD  Electronically Signed     TDF/MEDQ  D:  07/16/2007  T:  07/16/2007  Job:  [id number]

## 2010-06-05 NOTE — Op Note (Signed)
NAMEMACKENZY, [name]              ACCOUNT NO.:  [id number]   MEDICAL RECORD NO.:  [id number]          PATIENT TYPE:  AMB   LOCATION:  DAY                           FACILITY:  [facility]   PHYSICIAN:  Dalia Heading, M.D.  DATE OF BIRTH:  04/17/1955   DATE OF PROCEDURE:  01/17/2006  DATE OF DISCHARGE:                               OPERATIVE REPORT   PREOPERATIVE DIAGNOSIS:  Cholecystitis, cholelithiasis.   POSTOPERATIVE DIAGNOSIS:  Cholecystitis, cholelithiasis.   PROCEDURE:  Laparoscopic cholecystectomy.   SURGEON:  Dr. Franky Macho.   ANESTHESIA:  General endotracheal.   INDICATIONS:  The patient is a 55 year old black male who presents with  cholecystitis secondary to cholelithiasis.  The risks and benefits of  the procedure, including bleeding, infection, hepatobiliary injury, and  the possibility of an open procedure, were fully explained to the  patient, and he gave informed consent.   PROCEDURE NOTE:  The patient was placed in the supine position.  After  the induction of general endotracheal anesthesia, the abdomen was  prepped and draped using the usual sterile technique with Betadine.  Surgical site confirmation was performed.   A supraumbilical incision was made down to fascia.  A Veress needle was  introduced into the abdominal cavity and confirmation of placement was  done using the saline drop test.  The abdomen was then insufflated to 16  mmHg pressure.  An 11 mm trocar was introduced into the abdominal cavity  under direct visualization without difficulty.  The patient was placed  in reverse Trendelenburg position.  An additional 11 mm trocar was  placed in the epigastric region, and 5 mm trocars were placed in the  right upper quadrant and right flank regions.  The liver was inspected  and noted to be within normal limits.  The gallbladder was retracted  superiorly and laterally.  The dissection was begun around the  infundibulum of the gallbladder.  The cystic  duct was first identified.  The juncture to the infundibulum was fully identified.  Endoclips were  placed proximally and distally on the cystic duct and the cystic duct  was divided.  This was likewise done to the cystic artery.  The  gallbladder then freed away from the gallbladder fossa using Bovie  electrocautery.  The gallbladder was delivered through the epigastric  trocar site using an EndoCatch bag.  The gallbladder fossa was  inspected.  No abnormal bleeding or bile leakage was noted.  Surgicel  was placed in the gallbladder fossa.  All fluid and air were then  evacuated from the abdominal cavity prior to removal of the trocars.   All wounds were irrigated with normal saline.  All wounds were injected  with 0.5 cm Sensorcaine.  The supraumbilical fascia was reapproximated  using an 0-Vicryl interrupted suture.  All skin incisions were closed  using staples.  Betadine ointment and dry sterile dressings were  applied.   All tape and needle counts were correct at the end of the procedure.  The patient was extubated in the operating room and went back to the  recovery room awake in stable condition.  COMPLICATIONS:  None.   SPECIMEN:  Gallbladder stones.   BLOOD LOSS:  Minimal.      Dalia Heading, M.D.  Electronically Signed     MAJ/MEDQ  D:  01/17/2006  T:  01/17/2006  Job:  [id number]   cc:   Tesfaye D. Felecia Shelling, MD  Fax: [phone]

## 2010-06-05 NOTE — H&P (Signed)
[name], [name]              ACCOUNT NO.:  [id number]   MEDICAL RECORD NO.:  [id number]          PATIENT TYPE:  AMB   LOCATION:  DAY                           FACILITY:  [facility]   PHYSICIAN:  Dalia Heading, M.D.  DATE OF BIRTH:  18-Nov-1955   DATE OF ADMISSION:  DATE OF DISCHARGE:  LH                              HISTORY & PHYSICAL   CHIEF COMPLAINT:  Cholecystitis, cholelithiasis.   HISTORY OF PRESENT ILLNESS:  The patient is a 55 year old black male who  is referred for evaluation and treatment of biliary colic, secondary to  cholelithiasis.  He has been having right upper quadrant abdominal pain,  nausea, bloating for many weeks.  He does have fatty food intolerance.  No fever, chills, jaundice have been noted.   PAST MEDICAL HISTORY:  Includes COPD.   PAST SURGICAL HISTORY:  Unremarkable.   CURRENT MEDICATIONS:  1. Spiriva inhaler.  2. Hydrocodone for pain.   ALLERGIES:  NO KNOWN DRUG ALLERGIES.   REVIEW OF SYSTEMS:  The patient does drink several beers a day and also  smokes.  He denies an MI, chest pain, CVA, diabetes mellitus, bleeding  disorders.   PHYSICAL EXAMINATION:  GENERAL:  The patient is a well-developed, well-  nourished black male in no acute distress.  HEENT:  Examination reveals no scleral icterus.  LUNGS:  Clear to auscultation with equal breath sounds bilaterally.  HEART:  Examination reveals regular rate and rhythm without S3, S4,  murmurs.  ABDOMEN:  Soft and nondistended.  He is tender in the right upper  quadrant to palpation.  No hepatosplenomegaly, masses, hernias are  identified.  Ultrasound gallbladder reveals cholelithiasis with a normal  common bile duct.   IMPRESSION:  1. Cholecystitis.  2. Cholelithiasis.   PLAN:  The patient is scheduled for laparoscopic cholecystectomy on  January 17, 2006.  The risks and benefits of the procedure including  bleeding, infection, hepatobiliary, the possibly of an open procedure  were fully  explained to the patient, gave informed consent.      Dalia Heading, M.D.  Electronically Signed     MAJ/MEDQ  D:  01/13/2006  T:  01/13/2006  Job:  [id number]   cc:   Dalia Heading, M.D.  Fax: [phone]

## 2010-06-05 NOTE — Op Note (Signed)
[name], [name]              ACCOUNT NO.:  [id number]   MEDICAL RECORD NO.:  [id number]          PATIENT TYPE:  AMB   LOCATION:  DAY                           FACILITY:  [facility]   PHYSICIAN:  Barbaraann Barthel, M.D. DATE OF BIRTH:  08-08-1955   DATE OF PROCEDURE:  08/05/2004  DATE OF DISCHARGE:                                 OPERATIVE REPORT   PREOPERATIVE DIAGNOSIS:  Penile phimosis.   POSTOPERATIVE DIAGNOSIS:  Penile phimosis.   PROCEDURE:  Circumcision.   SURGEON:  Barbaraann Barthel, M.D.   SPECIMENRebekah Chesterfield.   NOTE:  This is a 55 year old black male who had difficulty cleaning his  penis and had phimosis and he was referred to me by Dr. Felecia Shelling for elective  circumcision.  We discussed complications not limited to but including  bleeding and infection and the need to refrain from sexual activity until  this has healed. Informed consent was obtained.   GROSS OPERATIVE FINDINGS:  Consistent with phimotic penile foreskin.   TECHNIQUE:  The patient was placed in the supine position after adequate  spinal anesthesia.  The area was prepped with Betadine solution and draped  in the usual manner.  A straight clamp was placed on the dorsal aspect of  the penis, we then placed two along the frenulum anteriorly and then removed  the redundant foreskin.  There were small bleeding points that were easily  cauterized with the needle tip cautery device and the mucosal layer and the  skin layer were then approximated with 4-0 Vicryl.  Prior to closure, all  sponge, needle and instrument counts were found to be correct.  The wound  was irrigated with saline and Xeroform and a tubular dressing were applied.  There were no complications and the patient was taken to the recovery room  in satisfactory condition.       WB/MEDQ  D:  08/05/2004  T:  08/05/2004  Job:  [id number]   cc:   Tesfaye D. Felecia Shelling, MD  [address]  [place]  [place] [address]  Fax: [phone]

## 2010-06-05 NOTE — Discharge Summary (Signed)
   NAME:  [name], [name]                        ACCOUNT NO.:  [id number]   MEDICAL RECORD NO.:  [id number]                   PATIENT TYPE:  INP   LOCATION:  A304                                 FACILITY:  [facility]   PHYSICIAN:  Tesfaye D. Felecia Shelling, M.D.              DATE OF BIRTH:  [birth year]   DATE OF ADMISSION:  12/17/2001  DATE OF DISCHARGE:  12/22/2001                                 DISCHARGE SUMMARY   DISCHARGE DIAGNOSES:  1. Cellulitis of the right side of the face.  2. Parotiditis.  3. History of degenerative joint disease.  4. Bronchitis.  5. History of alcoholic liver disease.   DISCHARGE MEDICATIONS:  1. Keflex 500 mg p.o. daily for five days.  2. Neurontin 600 mg p.o. three times daily.  3. Combivent 2 puffs p.o. q.i.d.  4. Hydrocodone 500/5 mg 1 tablet p.o. q.6h. p.r.n.   DISPOSITION:  The patient was discharged to home in stable condition.   HOSPITAL COURSE:  This is a 55 year old male patient who was admitted on  December 17, 2001, due to pain and swelling of his right side of the face  and parotid gland.  The patient was started on IV antibiotics.  Over the  hospital stay he gradually improved.  He was discharged back home on oral  antibiotics to continue his regular medication and finish his antibiotics.                                               Tesfaye D. Felecia Shelling, M.D.    TDF/MEDQ  D:  01/26/2002  T:  01/26/2002  Job:  [id number]

## 2010-06-05 NOTE — H&P (Signed)
NAME:  [name], [name]                        ACCOUNT NO.:  [id number]   MEDICAL RECORD NO.:  [id number]                   PATIENT TYPE:  INP   LOCATION:  A304                                 FACILITY:  [facility]   PHYSICIAN:  Hanley Hays. Dechurch, M.D.           DATE OF BIRTH:  06-29-1955   DATE OF ADMISSION:  12/17/2001  DATE OF DISCHARGE:                                HISTORY & PHYSICAL   HISTORY OF PRESENT ILLNESS:  A 55 year old African-American gentleman  followed by Dr. Felecia Shelling who was seen in the emergency room 12/16/01 with a two  day history of swollen left face.  He was given clindamycin at discharge.  He returned today with worsening swelling and pain, fever of 100.5, and  chills.  Evaluation revealed increase induration anterior to the left ear,  to the cheek, and at the sternocleidomastoid.  Tender and nonfluctuant.  A  CT scan was done in the emergency room which revealed no evidence of abscess  or periorbital involvement.  The patient is being admitted to the hospital  for IV antibiotics and local therapy.   REVIEW OF SYSTEMS:  The patient is disabled, although he is unable to tell  me why except for arthritis.  He denies any history of seizure, though he  is on Neurontin.  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  He is  edentulous.  He recently had an upper respiratory infection, as well as cold  sores on his left upper lip.  He has no GU complaints.  No dyspnea or other  complaints.  He noted a dry cough which is a residual of his cold.  He has  had no shortness of breath or swallowing difficulties.  He has a long  history of constipation apparently and bloating.  Denies any reflux per se.  He takes varying laxatives at home p.r.n.   MEDICATIONS:  1. Neurontin 600 t.i.d.  2. Combivent two puffs q.i.d.  3. He has p.r.n. hydrocodone and Skelaxin, of which he brings prescriptions     which still have multiple pills from October.   ALLERGIES:  Denies any history  of drug allergies.   SOCIAL HISTORY:  He is disabled secondary to arthritis.  He is single.  He  smokes one pack per day.  No alcohol use, through previously heavy abuse.   PAST MEDICAL HISTORY:  1. Arthritis.  2. Degenerative chronic back problems.  3. COPD.  4. Disability, although unclear as to why.  He does have evidence of head     trauma.   FAMILY MEDICAL HISTORY:  Five brothers, one sister.  One with diabetes.  Otherwise negative.   PHYSICAL EXAMINATION:  GENERAL:  A well-developed, well-nourished male.  Somewhat nasal voice.  He states this is secondary to a cold.  HEENT:  He has a pronounced indurated mass anterior to the left ear  extending to the cheek and to the  left sternocleidomastoid.  There is some  shotty adenopathy and tenderness, but the ear is not involved.  The ear  canal was normal.  The oropharynx revealed the tongue to be coated and  somewhat dry.  No lesions are present.  He is edentulous.  LUNGS:  Clear to auscultation, though diminished.  HEART:  Regular rate and rhythm, no murmur.  ABDOMEN:  Protuberant, soft, with bowel sounds.  EXTREMITIES:  Without clubbing, cyanosis, or edema.  He has scarring on the  left hand related to previous trauma.  Scarring on the head and forehead  related to previous trauma.  NEUROLOGIC:  Nonfocal.  He has a pronounced facial tic noted that he was  unaware of.  Pupils equal, round and reactive to light.  Fundi are not  visualized.  He is alert and oriented to time and place.  Follows simple  commands.  Mental status appears to be baseline.   ASSESSMENT/PLAN:  1. Indurated mass, left face.  Suspect cellulitis due to parotitis.  Will     give the patient IV fluids, as he seems somewhat intravascularly dry     _________.  Symptomatic treatment with warm compresses.  The benefits of     antibiotic in this setting is limited, although we will continue until     C&S returns.  Will begin anti-inflammatory to see if we can given  this     patient some symptomatic relief.  Monitor gastrointestinal tolerance.  2. Facial tic.  3. Chronic obstructive pulmonary disease on Combivent, stable.  4. Constipation.  Consider MiraLax or other bowel regimen.                                               Hanley Hays Josefine Class, M.D.    FED/MEDQ  D:  12/17/2001  T:  12/17/2001  Job:  [id number]

## 2010-10-09 LAB — STREP A DNA PROBE: Group A Strep Probe: NEGATIVE

## 2010-10-09 LAB — RAPID STREP SCREEN (MED CTR MEBANE ONLY): Streptococcus, Group A Screen (Direct): NEGATIVE

## 2010-10-12 LAB — COMPREHENSIVE METABOLIC PANEL
ALT: 19
AST: 35
CO2: 28
Calcium: 9.8
Chloride: 102
GFR calc Af Amer: >60
GFR calc non Af Amer: >60
Sodium: 137
Total Bilirubin: 0.7

## 2010-10-12 LAB — CBC
RBC: 4.04 — ABNORMAL LOW
WBC: 4.4

## 2010-10-13 LAB — BASIC METABOLIC PANEL
CO2: 27
Chloride: 102
Creatinine, Ser: 0.87
GFR calc Af Amer: >60

## 2010-10-13 LAB — CBC
HCT: 36.5 — ABNORMAL LOW
MCHC: 35.6
MCV: 91.7
RBC: 3.98 — ABNORMAL LOW

## 2010-10-15 LAB — CBC
HCT: 31.1 — ABNORMAL LOW
HCT: 30.5 — ABNORMAL LOW
HCT: 30.8 — ABNORMAL LOW
HCT: 31.3 — ABNORMAL LOW
HCT: 31.6 — ABNORMAL LOW
HCT: 32.3 — ABNORMAL LOW
HCT: 32.5 — ABNORMAL LOW
Hemoglobin: 10.9 — ABNORMAL LOW
Hemoglobin: 10.4 — ABNORMAL LOW
Hemoglobin: 10.6 — ABNORMAL LOW
Hemoglobin: 10.9 — ABNORMAL LOW
Hemoglobin: 11 — ABNORMAL LOW
MCHC: 34.8
MCHC: 33.1
MCHC: 33.8
MCHC: 33.8
MCHC: 34
MCHC: 34.1
MCHC: 34.1
MCHC: 34.2
MCHC: 34.2
MCHC: 34.5
MCHC: 34.5
MCHC: 34.5
MCV: 90.2
MCV: 91.2
MCV: 91.9
MCV: 92.2
MCV: 92.3
MCV: 92.4
MCV: 92.7
MCV: 92.8
MCV: 92.9
MCV: 94.2
Platelets: 121 — ABNORMAL LOW
Platelets: 125 — ABNORMAL LOW
Platelets: 125 — ABNORMAL LOW
Platelets: 128 — ABNORMAL LOW
Platelets: 128 — ABNORMAL LOW
Platelets: 131 — ABNORMAL LOW
Platelets: 132 — ABNORMAL LOW
Platelets: 133 — ABNORMAL LOW
Platelets: 134 — ABNORMAL LOW
Platelets: 136 — ABNORMAL LOW
RBC: 3.43 — ABNORMAL LOW
RBC: 3.3 — ABNORMAL LOW
RBC: 3.39 — ABNORMAL LOW
RBC: 3.39 — ABNORMAL LOW
RBC: 3.41 — ABNORMAL LOW
RBC: 3.41 — ABNORMAL LOW
RBC: 3.41 — ABNORMAL LOW
RBC: 3.43 — ABNORMAL LOW
RBC: 3.63 — ABNORMAL LOW
RDW: 15.7 — ABNORMAL HIGH
RDW: 16.4 — ABNORMAL HIGH
RDW: 16.9 — ABNORMAL HIGH
RDW: 17 — ABNORMAL HIGH
RDW: 17.4 — ABNORMAL HIGH
WBC: 16.4 — ABNORMAL HIGH
WBC: 5.8
WBC: 1.5 — ABNORMAL LOW
WBC: 1.5 — ABNORMAL LOW
WBC: 1.6 — ABNORMAL LOW
WBC: 1.6 — ABNORMAL LOW
WBC: 1.9 — ABNORMAL LOW
WBC: 2.2 — ABNORMAL LOW
WBC: 2.3 — ABNORMAL LOW
WBC: 2.6 — ABNORMAL LOW

## 2010-10-15 LAB — DIFFERENTIAL
Band Neutrophils: 10
Basophils Absolute: 0
Basophils Absolute: 0
Basophils Absolute: 0
Basophils Absolute: 0
Basophils Absolute: 0
Basophils Relative: 0
Basophils Relative: 0
Basophils Relative: 0
Basophils Relative: 0
Basophils Relative: 0
Basophils Relative: 1
Basophils Relative: 1
Basophils Relative: 1
Basophils Relative: 1
Basophils Relative: 1
Eosinophils Absolute: 0
Eosinophils Absolute: 0
Eosinophils Absolute: 0
Eosinophils Absolute: 0
Eosinophils Absolute: 0
Eosinophils Absolute: 0
Eosinophils Absolute: 0.1
Eosinophils Absolute: 0.1
Eosinophils Absolute: 0.1
Eosinophils Absolute: 0.1
Eosinophils Relative: 0
Eosinophils Relative: 1
Eosinophils Relative: 2
Eosinophils Relative: 2
Eosinophils Relative: 4
Eosinophils Relative: 5
Eosinophils Relative: 6 — ABNORMAL HIGH
Eosinophils Relative: 7 — ABNORMAL HIGH
Lymphocytes Relative: 1 — ABNORMAL LOW
Lymphocytes Relative: 5 — ABNORMAL LOW
Lymphocytes Relative: 30
Lymphocytes Relative: 38
Lymphs Abs: 0.2 — ABNORMAL LOW
Lymphs Abs: 0.3 — ABNORMAL LOW
Lymphs Abs: 0.3 — ABNORMAL LOW
Lymphs Abs: 0.3 — ABNORMAL LOW
Lymphs Abs: 0.4 — ABNORMAL LOW
Lymphs Abs: 0.4 — ABNORMAL LOW
Lymphs Abs: 0.4 — ABNORMAL LOW
Lymphs Abs: 0.4 — ABNORMAL LOW
Lymphs Abs: 0.5 — ABNORMAL LOW
Metamyelocytes Relative: 0
Monocytes Absolute: 0.4
Monocytes Absolute: 0.5
Monocytes Absolute: 0.3
Monocytes Absolute: 0.3
Monocytes Absolute: 0.3
Monocytes Absolute: 0.4
Monocytes Relative: 3
Monocytes Relative: 8
Monocytes Relative: 14 — ABNORMAL HIGH
Monocytes Relative: 17 — ABNORMAL HIGH
Monocytes Relative: 18 — ABNORMAL HIGH
Monocytes Relative: 21 — ABNORMAL HIGH
Monocytes Relative: 22 — ABNORMAL HIGH
Monocytes Relative: 24 — ABNORMAL HIGH
Monocytes Relative: 27 — ABNORMAL HIGH
Neutro Abs: 5
Neutro Abs: 0.6 — ABNORMAL LOW
Neutro Abs: 0.7 — ABNORMAL LOW
Neutro Abs: 0.7 — ABNORMAL LOW
Neutro Abs: 1.1 — ABNORMAL LOW
Neutro Abs: 1.2 — ABNORMAL LOW
Neutro Abs: 1.4 — ABNORMAL LOW
Neutro Abs: 1.5 — ABNORMAL LOW
Neutrophils Relative %: 34 — ABNORMAL LOW
Neutrophils Relative %: 35 — ABNORMAL LOW
Neutrophils Relative %: 42 — ABNORMAL LOW
Neutrophils Relative %: 44
Neutrophils Relative %: 54
Neutrophils Relative %: 58
Neutrophils Relative %: 62
Neutrophils Relative %: 65
Neutrophils Relative %: 69

## 2010-10-15 LAB — COMPREHENSIVE METABOLIC PANEL
AST: 28
Albumin: 3.4 — ABNORMAL LOW
Calcium: 9.8
Chloride: 103
Creatinine, Ser: 0.74
GFR calc Af Amer: >60
Total Protein: 7.3

## 2010-10-15 LAB — IRON AND TIBC
Iron: 41 — ABNORMAL LOW
Saturation Ratios: 18 — ABNORMAL LOW
TIBC: 226
UIBC: 185

## 2010-10-15 LAB — BASIC METABOLIC PANEL
BUN: 2 — ABNORMAL LOW
CO2: 27
Calcium: 9
Calcium: 9.3
Chloride: 105
Creatinine, Ser: 0.67
GFR calc Af Amer: >60
GFR calc Af Amer: >60
GFR calc non Af Amer: >60
GFR calc non Af Amer: >60
Glucose, Bld: 117 — ABNORMAL HIGH
Glucose, Bld: 110 — ABNORMAL HIGH
Potassium: 3.8
Potassium: 3.5
Potassium: 3.9
Sodium: 138
Sodium: 136

## 2010-10-15 LAB — CULTURE, BLOOD (ROUTINE X 2)
Culture: NO GROWTH
Culture: NO GROWTH

## 2010-10-15 LAB — URINALYSIS, ROUTINE W REFLEX MICROSCOPIC
Bilirubin Urine: NEGATIVE
Glucose, UA: NEGATIVE
Ketones, ur: NEGATIVE
Leukocytes, UA: NEGATIVE
Nitrite: NEGATIVE

## 2010-10-15 LAB — LIPASE, BLOOD: Lipase: 30

## 2010-10-15 LAB — FERRITIN: Ferritin: 517 — ABNORMAL HIGH (ref 22–322)

## 2010-10-15 LAB — URINE CULTURE: Colony Count: 25000

## 2010-10-15 LAB — URINE MICROSCOPIC-ADD ON

## 2010-10-15 LAB — FOLATE: Folate: >20

## 2011-01-20 ENCOUNTER — Emergency Department (HOSPITAL_COMMUNITY): Payer: Medicare Other

## 2011-01-20 ENCOUNTER — Emergency Department (HOSPITAL_COMMUNITY)
Admission: EM | Admit: 2011-01-20 | Discharge: 2011-01-20 | Disposition: A | Discharge status: Home / Self Care | Payer: Medicare Other | Attending: Emergency Medicine | Admitting: Emergency Medicine

## 2011-01-20 DIAGNOSIS — R059 Cough, unspecified: Secondary | ICD-10-CM | POA: Insufficient documentation

## 2011-01-20 DIAGNOSIS — R05 Cough: Secondary | ICD-10-CM

## 2011-01-20 DIAGNOSIS — R51 Headache: Secondary | ICD-10-CM | POA: Insufficient documentation

## 2011-01-20 DIAGNOSIS — J45909 Unspecified asthma, uncomplicated: Secondary | ICD-10-CM | POA: Insufficient documentation

## 2011-01-20 DIAGNOSIS — R5381 Other malaise: Secondary | ICD-10-CM | POA: Insufficient documentation

## 2011-01-20 DIAGNOSIS — R5383 Other fatigue: Secondary | ICD-10-CM | POA: Insufficient documentation

## 2011-01-20 HISTORY — DX: Malignant (primary) neoplasm, unspecified: C80.1

## 2011-01-20 MED ORDER — HYDROCODONE-ACETAMINOPHEN 5-325 MG PO TABS: ORAL_TABLET | ORAL | Status: DC

## 2011-01-20 MED ORDER — ALBUTEROL SULFATE (5 MG/ML) 0.5% IN NEBU
2.5000 mg | INHALATION_SOLUTION | Freq: Once | RESPIRATORY_TRACT | Status: AC
  Administered 2011-01-20: 2.5 mg via RESPIRATORY_TRACT
  Filled 2011-01-20: qty 0.5

## 2011-01-20 MED ORDER — HYDROCODONE-ACETAMINOPHEN 5-325 MG PO TABS
1.0000 | ORAL_TABLET | Freq: Once | ORAL | Status: AC
  Administered 2011-01-20: 1 via ORAL
  Filled 2011-01-20: qty 1

## 2011-01-20 MED ORDER — PREDNISONE 20 MG PO TABS
60.0000 mg | ORAL_TABLET | Freq: Once | ORAL | Status: AC
  Administered 2011-01-20: 60 mg via ORAL
  Filled 2011-01-20: qty 3

## 2011-01-20 MED ORDER — IPRATROPIUM BROMIDE 0.02 % IN SOLN
0.5000 mg | Freq: Once | RESPIRATORY_TRACT | Status: AC
  Administered 2011-01-20: 0.5 mg via RESPIRATORY_TRACT
  Filled 2011-01-20: qty 2.5

## 2011-01-20 NOTE — ED Notes (Signed)
Resp called for breathing treatment.   

## 2011-01-20 NOTE — ED Provider Notes (Signed)
Scribed for [employer]. Colon Branch, MD, the patient was seen in room APA10/APA10 . This chart was scribed by Ellie Lunch.   CSN: [id number]  Arrival date & time 01/20/11  1407   First MD Initiated Contact with Patient 01/20/11 1630      Chief Complaint  Patient presents with  . Cough  . Headache  . Fatigue    (Consider location/radiation/quality/duration/timing/severity/associated sxs/prior treatment) HPI Pt seen at 4:46 PM [name] is a 56 y.o. male who presents to the Emergency Department complaining of 1.5 days of productive cough with white phlegm. Cough is associated with wheezing. Pt has h/o of asthma. Pt is treating cough with 2 inhalers and nightquil with no improvement. Pt has h/o of throat cancer 3 years ago which was treated with radiation.   Past Medical History  Diagnosis Date  . Asthma   . Cancer     No past surgical history on file.  No family history on file.  History  Substance Use Topics  . Smoking status: Never Smoker   . Smokeless tobacco: Not on file  . Alcohol Use: Yes      Review of Systems 10 Systems reviewed and are negative for acute change except as noted in the HPI.  Allergies  Review of patient's allergies indicates no known allergies.  Home Medications  No current outpatient prescriptions on file.  BP 144/92  Pulse 118  Temp(Src) 99.1 F (37.3 C) (Oral)  Resp 21  Ht 5\' 11"  (1.803 m)  Wt 220 lb (99.791 kg)  BMI 30.68 kg/m2  SpO2 96%  Physical Exam  Nursing note and vitals reviewed. Constitutional: He is oriented to person, place, and time. He appears well-developed and well-nourished. No distress.  HENT:  Head: Normocephalic and atraumatic.       edentulous   Eyes: EOM are normal. Pupils are equal, round, and reactive to light.  Neck: Normal range of motion. Neck supple.  Cardiovascular: Normal rate, regular rhythm and normal heart sounds.   Pulmonary/Chest: Effort normal.       Fair air movement with wheezing  throughout all lung fields. No rales or rhonchi.  Paroxysmal coughing on exam.  Abdominal: Soft. There is no tenderness.  Musculoskeletal: Normal range of motion.  Neurological: He is alert and oriented to person, place, and time.  Skin: Skin is warm and dry.  Psychiatric: He has a normal mood and affect.    ED Course  Procedures (including critical care time) DIAGNOSTIC STUDIES: Oxygen Saturation is 96% on room air, normal by my interpretation.    COORDINATION OF CARE:  Dg Chest 2 View  01/20/2011  *RADIOLOGY REPORT*  Clinical Data: Cough and congestion for 2 days.  History of throat cancer.  CHEST - 2 VIEW  Comparison: 01/22/2010.  Findings: Port-A-Cath has been removed.  Heart size and mediastinal contours are stable.  There is stable biapical pleural thickening. The lungs are clear.  There is no pleural effusion.  No osseous abnormalities are identified.  IMPRESSION: Stable examination.  No active cardiopulmonary process.  Original Report Authenticated By: Gerrianne Scale, M.D.     MDM  Patient with cough, sore throat and body aches for several days. Chest xray negative. Improvement with albuterol and atrovent. Pt feels improved after observation and/or treatment in ED.Pt stable in ED with no significant deterioration in condition.The patient appears reasonably screened and/or stabilized for discharge and I doubt any other medical condition or other [facility] requiring further screening, evaluation, or treatment in the ED  at this time prior to discharge.  I personally performed the services described in this documentation, which was scribed in my presence. The recorded information has been reviewed and considered.   MDM Reviewed: nursing note and vitals Interpretation: x-ray         Nicoletta Dress. Colon Branch, MD 01/20/11 2026

## 2011-01-20 NOTE — ED Notes (Signed)
Pt reports cough, ha, sore throat and body aches for the past few days.  Pt denies any n/v or fevers at home.  Pt has hx of asthma.

## 2011-02-11 ENCOUNTER — Ambulatory Visit (HOSPITAL_COMMUNITY)
Admission: RE | Admit: 2011-02-11 | Discharge: 2011-02-11 | Disposition: A | Discharge status: Home / Self Care | Payer: Medicare Other | Source: Ambulatory Visit | Attending: Otolaryngology | Admitting: Otolaryngology

## 2011-02-11 ENCOUNTER — Other Ambulatory Visit (HOSPITAL_COMMUNITY): Payer: Self-pay | Admitting: Otolaryngology

## 2011-02-11 DIAGNOSIS — C099 Malignant neoplasm of tonsil, unspecified: Secondary | ICD-10-CM

## 2011-02-11 DIAGNOSIS — R05 Cough: Secondary | ICD-10-CM | POA: Insufficient documentation

## 2011-02-11 DIAGNOSIS — R059 Cough, unspecified: Secondary | ICD-10-CM | POA: Insufficient documentation

## 2011-08-29 ENCOUNTER — Emergency Department (HOSPITAL_COMMUNITY)
Admission: EM | Admit: 2011-08-29 | Discharge: 2011-08-29 | Disposition: A | Discharge status: Home / Self Care | Payer: Medicare Other | Attending: Emergency Medicine | Admitting: Emergency Medicine

## 2011-08-29 ENCOUNTER — Encounter (HOSPITAL_COMMUNITY): Payer: Self-pay | Admitting: *Deleted

## 2011-08-29 DIAGNOSIS — Z859 Personal history of malignant neoplasm, unspecified: Secondary | ICD-10-CM | POA: Insufficient documentation

## 2011-08-29 DIAGNOSIS — K644 Residual hemorrhoidal skin tags: Secondary | ICD-10-CM | POA: Insufficient documentation

## 2011-08-29 DIAGNOSIS — J45909 Unspecified asthma, uncomplicated: Secondary | ICD-10-CM | POA: Insufficient documentation

## 2011-08-29 LAB — BASIC METABOLIC PANEL
CO2: 28 mEq/L (ref 19–32)
Glucose, Bld: 120 mg/dL — ABNORMAL HIGH (ref 70–99)
Potassium: 3.8 mEq/L (ref 3.5–5.1)
Sodium: 130 mEq/L — ABNORMAL LOW (ref 135–145)

## 2011-08-29 LAB — CBC WITH DIFFERENTIAL/PLATELET
Lymphocytes Relative: 58 % — ABNORMAL HIGH (ref 12–46)
Lymphs Abs: 1.5 10*3/uL (ref 0.7–4.0)
MCV: 91.6 fL (ref 78.0–100.0)
Neutrophils Relative %: 29 % — ABNORMAL LOW (ref 43–77)
Platelets: 105 10*3/uL — ABNORMAL LOW (ref 150–400)
RBC: 3.82 MIL/uL — ABNORMAL LOW (ref 4.22–5.81)
WBC: 2.5 10*3/uL — ABNORMAL LOW (ref 4.0–10.5)

## 2011-08-29 MED ORDER — HYDROCORTISONE 2.5 % RE CREA: TOPICAL_CREAM | RECTAL | Status: AC

## 2011-08-29 NOTE — ED Notes (Signed)
Pt c/o rectal pain, has hx of hemorrhoids, pt also states that when he wipes his rectal area he notices bright red blood, small amount of blood noted, pt unsure of when it started.

## 2011-08-29 NOTE — ED Notes (Signed)
Patient states he felt dizzy upon standing.

## 2011-08-29 NOTE — ED Notes (Signed)
Patient with no complaints at this time. Respirations even and unlabored. Skin warm/dry. Discharge instructions reviewed with patient at this time. Patient given opportunity to voice concerns/ask questions. Patient discharged at this time and left Emergency Department with steady gait.   

## 2011-08-29 NOTE — ED Provider Notes (Signed)
History     CSN: [id number]  Arrival date & time 08/29/11  1055   First MD Initiated Contact with Patient 08/29/11 1241      Chief Complaint  Patient presents with  . Rectal Pain    (Consider location/radiation/quality/duration/timing/severity/associated sxs/prior treatment) HPI Comments: Patient c/o pain to his rectum for several days.  Reports hx of hemorrhoids.  States that recently when he wipes he notices bright red blood.  Pain is worse after defecation.  Denies abd pain, persistent rectal bleeding,  vomiting, hematemesis, fever, general weakness or dizziness.  States he has tried warm soaks w/o improvement.  The history is provided by the patient.    Past Medical History  Diagnosis Date  . Asthma   . Cancer     Past Surgical History  Procedure Date  . Cholecystectomy     No family history on file.  History  Substance Use Topics  . Smoking status: Never Smoker   . Smokeless tobacco: Not on file  . Alcohol Use: Yes      Review of Systems  Constitutional: Negative for fever and appetite change.  Respiratory: Negative for chest tightness and shortness of breath.   Cardiovascular: Negative for chest pain.  Gastrointestinal: Positive for anal bleeding and rectal pain. Negative for nausea, vomiting, abdominal pain, diarrhea, constipation, blood in stool and abdominal distention.  Genitourinary: Negative for flank pain and difficulty urinating.  Musculoskeletal: Negative for back pain.  Neurological: Negative for dizziness and weakness.  All other systems reviewed and are negative.    Allergies  Review of patient's allergies indicates no known allergies.  Home Medications   Current Outpatient Rx  Name Route Sig Dispense Refill  . ALBUTEROL SULFATE HFA 108 (90 BASE) MCG/ACT IN AERS Inhalation Inhale 2 puffs into the lungs 4 (four) times daily.     Marland Kitchen GABAPENTIN 600 MG PO TABS Oral Take 600 mg by mouth 3 (three) times daily.      Marland Kitchen HYDROCODONE-ACETAMINOPHEN  5-500 MG PO TABS Oral Take 1 tablet by mouth every 6 (six) hours as needed. For pain      . OMEPRAZOLE 40 MG PO CPDR Oral Take 40 mg by mouth daily.        BP 147/101  Pulse 69  Temp 98 F (36.7 C)  Resp 20  Ht 5\' 11"  (1.803 m)  Wt 199 lb (90.266 kg)  BMI 27.75 kg/m2  SpO2 100%  Physical Exam  Nursing note and vitals reviewed. Constitutional: He is oriented to person, place, and time. He appears well-developed and well-nourished. No distress.  HENT:  Head: Normocephalic and atraumatic.  Mouth/Throat: Oropharynx is clear and moist.  Cardiovascular: Normal rate, regular rhythm, normal heart sounds and intact distal pulses.   No murmur heard. Pulmonary/Chest: Effort normal and breath sounds normal.  Abdominal: Soft. Bowel sounds are normal. He exhibits no distension and no mass. There is no tenderness. There is no rebound and no guarding.  Genitourinary: Rectal exam shows external hemorrhoid. Rectal exam shows no internal hemorrhoid, no fissure, no mass, no tenderness and anal tone normal. Guaiac negative stool. Prostate is not tender.       Two small external, non-thrombosed  hemorrhoids.  No active bleeding.  No rectal masses  Musculoskeletal: He exhibits no edema.  Neurological: He is alert and oriented to person, place, and time.  Skin: Skin is warm and dry.    ED Course  Procedures (including critical care time)  Labs Reviewed  CBC WITH DIFFERENTIAL - Abnormal; Notable  for the following:    WBC 2.5 (*)  WHITE COUNT CONFIRMED ON SMEAR   RBC 3.82 (*)     Hemoglobin 12.9 (*)     HCT 35.0 (*)     MCHC 36.9 (*)     Platelets 105 (*)     Neutrophils Relative 29 (*)     Neutro Abs 0.7 (*)     Lymphocytes Relative 58 (*)     All other components within normal limits  BASIC METABOLIC PANEL - Abnormal; Notable for the following:    Sodium 130 (*)     Glucose, Bld 120 (*)     GFR calc non Af Amer 82 (*)     All other components within normal limits        MDM    Previous ED charts, nursing notes reviewed  External hemorrhoids.  guiac negative , brown stool.  abd NT.  Pt is well appearing.  Labs reviewed, hx of CA with previous leukopenia and anemia.  Results improved from 7/09   Discussed results and pt hx with EDP prior to d/c.  Pt agrees to close f/u with his PMD this week for recheck.  Rectal bleeding likely result of bleeding hemorrhoids.  Pt ambulates with steady gait, no focal neuro deficits.    The patient appears reasonably screened and/or stabilized for discharge and I doubt any other medical condition or other [facility] requiring further screening, evaluation, or treatment in the ED at this time prior to discharge.   Prescribed Anusol HC cream    Yanil Dawe L. Ethelyne Erich, [place] 09/02/11 1501

## 2011-09-06 NOTE — ED Provider Notes (Signed)
Medical screening examination/treatment/procedure(s) were performed by non-physician practitioner and as supervising physician I was immediately available for consultation/collaboration.  Lucero Auzenne, MD 09/06/11 1945 

## 2011-10-07 ENCOUNTER — Ambulatory Visit (HOSPITAL_COMMUNITY)
Admission: RE | Admit: 2011-10-07 | Discharge: 2011-10-07 | Disposition: A | Discharge status: Home / Self Care | Payer: Medicare Other | Source: Ambulatory Visit | Attending: Internal Medicine | Admitting: Internal Medicine

## 2011-10-07 ENCOUNTER — Other Ambulatory Visit (HOSPITAL_COMMUNITY): Payer: Self-pay | Admitting: Internal Medicine

## 2011-10-07 DIAGNOSIS — M25559 Pain in unspecified hip: Secondary | ICD-10-CM

## 2012-05-31 ENCOUNTER — Emergency Department (HOSPITAL_COMMUNITY)
Admission: EM | Admit: 2012-05-31 | Discharge: 2012-05-31 | Disposition: A | Discharge status: Home / Self Care | Payer: Medicare Other | Attending: Emergency Medicine | Admitting: Emergency Medicine

## 2012-05-31 ENCOUNTER — Emergency Department (HOSPITAL_COMMUNITY): Payer: Medicare Other

## 2012-05-31 ENCOUNTER — Encounter (HOSPITAL_COMMUNITY): Payer: Self-pay

## 2012-05-31 DIAGNOSIS — Z23 Encounter for immunization: Secondary | ICD-10-CM | POA: Insufficient documentation

## 2012-05-31 DIAGNOSIS — F1010 Alcohol abuse, uncomplicated: Secondary | ICD-10-CM | POA: Insufficient documentation

## 2012-05-31 DIAGNOSIS — R109 Unspecified abdominal pain: Secondary | ICD-10-CM | POA: Insufficient documentation

## 2012-05-31 DIAGNOSIS — S21109A Unspecified open wound of unspecified front wall of thorax without penetration into thoracic cavity, initial encounter: Secondary | ICD-10-CM | POA: Insufficient documentation

## 2012-05-31 DIAGNOSIS — J45909 Unspecified asthma, uncomplicated: Secondary | ICD-10-CM | POA: Insufficient documentation

## 2012-05-31 DIAGNOSIS — F10929 Alcohol use, unspecified with intoxication, unspecified: Secondary | ICD-10-CM

## 2012-05-31 DIAGNOSIS — Z859 Personal history of malignant neoplasm, unspecified: Secondary | ICD-10-CM | POA: Insufficient documentation

## 2012-05-31 DIAGNOSIS — S21119A Laceration without foreign body of unspecified front wall of thorax without penetration into thoracic cavity, initial encounter: Secondary | ICD-10-CM

## 2012-05-31 DIAGNOSIS — F17200 Nicotine dependence, unspecified, uncomplicated: Secondary | ICD-10-CM | POA: Insufficient documentation

## 2012-05-31 DIAGNOSIS — Z79899 Other long term (current) drug therapy: Secondary | ICD-10-CM | POA: Insufficient documentation

## 2012-05-31 LAB — CBC WITH DIFFERENTIAL/PLATELET
Basophils Absolute: 0 10*3/uL (ref 0.0–0.1)
Basophils Relative: 1 % (ref 0–1)
Eosinophils Absolute: 0 10*3/uL (ref 0.0–0.7)
Eosinophils Relative: 1 % (ref 0–5)
HCT: 30.8 % — ABNORMAL LOW (ref 39.0–52.0)
Hemoglobin: 11.2 g/dL — ABNORMAL LOW (ref 13.0–17.0)
Lymphocytes Relative: 65 % — ABNORMAL HIGH (ref 12–46)
Lymphs Abs: 2.3 10*3/uL (ref 0.7–4.0)
MCH: 35.8 pg — ABNORMAL HIGH (ref 26.0–34.0)
MCHC: 36.4 g/dL — ABNORMAL HIGH (ref 30.0–36.0)
MCV: 98.4 fL (ref 78.0–100.0)
Monocytes Absolute: 0.2 10*3/uL (ref 0.1–1.0)
Monocytes Relative: 7 % (ref 3–12)
Neutro Abs: 0.9 10*3/uL — ABNORMAL LOW (ref 1.7–7.7)
Neutrophils Relative %: 26 % — ABNORMAL LOW (ref 43–77)
Platelets: 101 10*3/uL — ABNORMAL LOW (ref 150–400)
RBC: 3.13 MIL/uL — ABNORMAL LOW (ref 4.22–5.81)
RDW: 15.6 % — ABNORMAL HIGH (ref 11.5–15.5)
WBC: 3.4 10*3/uL — ABNORMAL LOW (ref 4.0–10.5)

## 2012-05-31 LAB — COMPREHENSIVE METABOLIC PANEL
ALT: 50 U/L (ref 0–53)
AST: 192 U/L — ABNORMAL HIGH (ref 0–37)
Albumin: 2.9 g/dL — ABNORMAL LOW (ref 3.5–5.2)
Alkaline Phosphatase: 168 U/L — ABNORMAL HIGH (ref 39–117)
BUN: 12 mg/dL (ref 6–23)
CO2: 23 mEq/L (ref 19–32)
Calcium: 8.4 mg/dL (ref 8.4–10.5)
Chloride: 98 mEq/L (ref 96–112)
Creatinine, Ser: 0.85 mg/dL (ref 0.50–1.35)
GFR calc Af Amer: >90 mL/min (ref >90)
GFR calc non Af Amer: >90 mL/min (ref >90)
Glucose, Bld: 110 mg/dL — ABNORMAL HIGH (ref 70–99)
Potassium: 3.3 mEq/L — ABNORMAL LOW (ref 3.5–5.1)
Sodium: 133 mEq/L — ABNORMAL LOW (ref 135–145)
Total Bilirubin: 0.7 mg/dL (ref 0.3–1.2)
Total Protein: 7.1 g/dL (ref 6.0–8.3)

## 2012-05-31 LAB — TYPE AND SCREEN
ABO/RH(D): A POS
Antibody Screen: NEGATIVE

## 2012-05-31 LAB — ETHANOL: Alcohol, Ethyl (B): 249 mg/dL — ABNORMAL HIGH (ref 0–11)

## 2012-05-31 LAB — ABO/RH: ABO/RH(D): A POS

## 2012-05-31 MED ORDER — TETANUS-DIPHTH-ACELL PERTUSSIS 5-2.5-18.5 LF-MCG/0.5 IM SUSP
0.5000 mL | Freq: Once | INTRAMUSCULAR | Status: AC
  Administered 2012-05-31: 0.5 mL via INTRAMUSCULAR
  Filled 2012-05-31: qty 0.5

## 2012-05-31 MED ORDER — MORPHINE SULFATE 4 MG/ML IJ SOLN
4.0000 mg | Freq: Once | INTRAMUSCULAR | Status: AC
  Administered 2012-05-31: 4 mg via INTRAVENOUS
  Filled 2012-05-31: qty 1

## 2012-05-31 MED ORDER — IOHEXOL 300 MG/ML  SOLN
100.0000 mL | Freq: Once | INTRAMUSCULAR | Status: AC | PRN
  Administered 2012-05-31: 100 mL via INTRAVENOUS

## 2012-05-31 MED ORDER — LIDOCAINE-EPINEPHRINE 2 %-1:100000 IJ SOLN: 10.0000 mL | Freq: Once | INTRAMUSCULAR | Status: DC

## 2012-05-31 MED ORDER — SODIUM CHLORIDE 0.9 % IV BOLUS (SEPSIS)
1000.0000 mL | Freq: Once | INTRAVENOUS | Status: AC
  Administered 2012-05-31: 1000 mL via INTRAVENOUS

## 2012-05-31 MED ORDER — LIDOCAINE-EPINEPHRINE (PF) 2 %-1:200000 IJ SOLN
INTRAMUSCULAR | Status: AC
  Filled 2012-05-31: qty 20

## 2012-05-31 MED ORDER — ONDANSETRON HCL 4 MG/2ML IJ SOLN
4.0000 mg | Freq: Once | INTRAMUSCULAR | Status: AC
  Administered 2012-05-31: 4 mg via INTRAVENOUS
  Filled 2012-05-31: qty 2

## 2012-05-31 NOTE — ED Notes (Signed)
Gave patient a swab and little cup of water to moisten his mouth
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
## 2012-05-31 NOTE — ED Notes (Signed)
Put  4x4 gauze on wond with pressure and tayped

## 2012-05-31 NOTE — ED Notes (Signed)
Dr.kohut to bedside for exam, pt denies any resp distress, able to answer all ?'s
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
## 2013-04-01 ENCOUNTER — Emergency Department (HOSPITAL_COMMUNITY)
Admission: EM | Admit: 2013-04-01 | Discharge: 2013-04-01 | Disposition: A | Discharge status: Home / Self Care | Payer: Medicare Other | Attending: Emergency Medicine | Admitting: Emergency Medicine

## 2013-04-01 ENCOUNTER — Encounter (HOSPITAL_COMMUNITY): Payer: Self-pay | Admitting: Emergency Medicine

## 2013-04-01 DIAGNOSIS — F17200 Nicotine dependence, unspecified, uncomplicated: Secondary | ICD-10-CM | POA: Insufficient documentation

## 2013-04-01 DIAGNOSIS — R040 Epistaxis: Secondary | ICD-10-CM | POA: Insufficient documentation

## 2013-04-01 DIAGNOSIS — Z859 Personal history of malignant neoplasm, unspecified: Secondary | ICD-10-CM | POA: Insufficient documentation

## 2013-04-01 DIAGNOSIS — J45909 Unspecified asthma, uncomplicated: Secondary | ICD-10-CM | POA: Insufficient documentation

## 2013-04-01 DIAGNOSIS — J329 Chronic sinusitis, unspecified: Secondary | ICD-10-CM | POA: Insufficient documentation

## 2013-04-01 DIAGNOSIS — Z79899 Other long term (current) drug therapy: Secondary | ICD-10-CM | POA: Insufficient documentation

## 2013-04-01 DIAGNOSIS — IMO0002 Reserved for concepts with insufficient information to code with codable children: Secondary | ICD-10-CM | POA: Insufficient documentation

## 2013-04-01 MED ORDER — AMOXICILLIN 500 MG PO CAPS: 500.0000 mg | ORAL_CAPSULE | Freq: Three times a day (TID) | ORAL | Status: AC

## 2013-04-01 MED ORDER — AMOXICILLIN 500 MG PO CAPS: 500.0000 mg | ORAL_CAPSULE | Freq: Three times a day (TID) | ORAL | Status: DC

## 2013-04-01 MED ORDER — FLUTICASONE PROPIONATE 50 MCG/ACT NA SUSP: NASAL | Status: DC

## 2013-04-01 MED ORDER — SILVER NITRATE-POT NITRATE 75-25 % EX MISC: 1.0000 | Freq: Once | CUTANEOUS | Status: AC

## 2013-04-01 MED ORDER — SILVER NITRATE-POT NITRATE 75-25 % EX MISC
CUTANEOUS | Status: AC
  Administered 2013-04-01: 1 via TOPICAL
  Filled 2013-04-01: qty 1

## 2013-04-01 NOTE — ED Notes (Addendum)
Pt c/o ha/nasal congestion and productive cough with blood tinged yellow sputum x 1 week. Pt also states he has lost >25 lbs over the last 5 months. Pt states he has h/s ca but can not remember what kind.

## 2013-04-01 NOTE — ED Provider Notes (Signed)
CSN: [id number]     Arrival date & time 04/01/13  1478 History   This chart was scribed for Tanna Furry, MD by Era Bumpers, ED scribe. This patient was seen in room APA15/APA15 and the patient's care was started at 0951.  Chief Complaint  Patient presents with  . Nasal Congestion   The history is provided by the patient. No language interpreter was used.   HPI Comments: [name] is a 58 y.o. male who presents to the Emergency Department complaining of nasal congestion, productive of phlegm for the past week and a half. He presents this AM because he began having blood tinged sputum last PM. He reports his congestion worsens when he lays down. He saw his PCP for this problem and was tx for sinus infection w/zithromax, had mild improvement. He denies any other sx such as neck pain or emesis.   Past Medical History  Diagnosis Date  . Asthma   . Cancer    Past Surgical History  Procedure Laterality Date  . Cholecystectomy     No family history on file. History  Substance Use Topics  . Smoking status: Current Every Day Smoker    Types: Cigarettes  . Smokeless tobacco: Not on file  . Alcohol Use: Yes     Comment: daily    Review of Systems  Constitutional: Negative for fever, chills, diaphoresis, appetite change and fatigue.  HENT: Negative for mouth sores, sore throat and trouble swallowing.   Eyes: Negative for visual disturbance.  Respiratory: Negative for cough, chest tightness, shortness of breath and wheezing.   Cardiovascular: Negative for chest pain.  Gastrointestinal: Negative for nausea, vomiting, abdominal pain, diarrhea and abdominal distention.  Endocrine: Negative for polydipsia, polyphagia and polyuria.  Genitourinary: Negative for dysuria, frequency and hematuria.  Musculoskeletal: Negative for gait problem.  Skin: Negative for color change, pallor and rash.  Neurological: Negative for dizziness, syncope, light-headedness and headaches.  Hematological: Does  not bruise/bleed easily.  Psychiatric/Behavioral: Negative for behavioral problems and confusion.  All other systems reviewed and are negative.    Allergies  Review of patient's allergies indicates no known allergies.  Home Medications   Current Outpatient Rx  Name  Route  Sig  Dispense  Refill  . albuterol (PROVENTIL HFA;VENTOLIN HFA) 108 (90 BASE) MCG/ACT inhaler   Inhalation   Inhale 2 puffs into the lungs 4 (four) times daily.          Marland Kitchen amoxicillin (AMOXIL) 500 MG capsule   Oral   Take 1 capsule (500 mg total) by mouth 3 (three) times daily.   21 capsule   0   . amoxicillin (AMOXIL) 500 MG capsule   Oral   Take 1 capsule (500 mg total) by mouth 3 (three) times daily.   21 capsule   0   . fluticasone (FLONASE) 50 MCG/ACT nasal spray      1 spray each nares twice a day   10 g   1   . fluticasone (FLONASE) 50 MCG/ACT nasal spray      1 spray each nares twice a day   10 g   1   . gabapentin (NEURONTIN) 600 MG tablet   Oral   Take 600 mg by mouth 3 (three) times daily.           Marland Kitchen HYDROcodone-acetaminophen (NORCO/VICODIN) 5-325 MG per tablet   Oral   Take 1 tablet by mouth every 6 (six) hours as needed for pain.         Marland Kitchen  Multiple Vitamin (MULTIVITAMIN WITH MINERALS) TABS   Oral   Take 1 tablet by mouth daily.         Marland Kitchen omeprazole (PRILOSEC) 40 MG capsule   Oral   Take 40 mg by mouth daily.            Triage Vitals: BP 136/101  Pulse 103  Temp(Src) 98.8 F (37.1 C)  Resp 18  Ht 5\' 11"  (1.803 m)  Wt 173 lb (78.472 kg)  BMI 24.14 kg/m2  SpO2 100%  Physical Exam  Constitutional: He is oriented to person, place, and time. He appears well-developed and well-nourished. No distress.  HENT:  Head: Normocephalic.  Blood medial right nares  Eyes: Conjunctivae are normal. Pupils are equal, round, and reactive to light. No scleral icterus.  Neck: Normal range of motion. Neck supple. No thyromegaly present.  Cardiovascular: Normal rate and  regular rhythm.  Exam reveals no gallop and no friction rub.   No murmur heard. Pulmonary/Chest: Effort normal and breath sounds normal. No respiratory distress. He has no wheezes. He has no rales.  Abdominal: Soft. Bowel sounds are normal. He exhibits no distension. There is no tenderness. There is no rebound.  Musculoskeletal: Normal range of motion.  Neurological: He is alert and oriented to person, place, and time.  Skin: Skin is warm and dry. No rash noted.  Psychiatric: He has a normal mood and affect. His behavior is normal.    ED Course  Procedures (including critical care time) DIAGNOSTIC STUDIES: Oxygen Saturation is 100% on room air, normal by my interpretation.    COORDINATION OF CARE: At 1015 AM Discussed treatment plan with patient. Patient agrees.   Labs Review Labs Reviewed - No data to display Imaging Review No results found.   EKG Interpretation None      MDM   Final diagnoses:  Sinusitis  Epistaxis    Nose is cauterized with silver nitrate. Good residual hemostasis.   I personally performed the services described in this documentation, which was scribed in my presence. The recorded information has been reviewed and is accurate.      Tanna Furry, MD 04/01/13 1025

## 2013-04-01 NOTE — Discharge Instructions (Signed)
Avoid blowing your nose. Use a vaporizer or humidifier at night. If your nose bleeds again sit down in a chair and pinch your nose for 10 minutes   Nosebleed Nosebleeds can be caused by many conditions including trauma, infections, polyps, foreign bodies, dry mucous membranes or climate, medications and air conditioning. Most nosebleeds occur in the front of the nose. It is because of this location that most nosebleeds can be controlled by pinching the nostrils gently and continuously. Do this for at least 10 to 20 minutes. The reason for this long continuous pressure is that you must hold it long enough for the blood to clot. If during that 10 to 20 minute time period, pressure is released, the process may have to be started again. The nosebleed may stop by itself, quit with pressure, need concentrated heating (cautery) or stop with pressure from packing. HOME CARE INSTRUCTIONS   If your nose was packed, try to maintain the pack inside until your caregiver removes it. If a gauze pack was used and it starts to fall out, gently replace or cut the end off. Do not cut if a balloon catheter was used to pack the nose. Otherwise, do not remove unless instructed.  Avoid blowing your nose for 12 hours after treatment. This could dislodge the pack or clot and start bleeding again.  If the bleeding starts again, sit up and bending forward, gently pinch the front half of your nose continuously for 20 minutes.  If bleeding was caused by dry mucous membranes, cover the inside of your nose every morning with a petroleum or antibiotic ointment. Use your little fingertip as an applicator. Do this as needed during dry weather. This will keep the mucous membranes moist and allow them to heal.  Maintain humidity in your home by using less air conditioning or using a humidifier.  Do not use aspirin or medications which make bleeding more likely. Your caregiver can give you recommendations on this.  Resume normal  activities as able but try to avoid straining, lifting or bending at the waist for several days.  If the nosebleeds become recurrent and the cause is unknown, your caregiver may suggest laboratory tests. SEEK IMMEDIATE MEDICAL CARE IF:   Bleeding recurs and cannot be controlled.  There is unusual bleeding from or bruising on other parts of the body.  You have a fever.  Nosebleeds continue.  There is any worsening of the condition which originally brought you in.  You become lightheaded, feel faint, become sweaty or vomit blood. MAKE SURE YOU:   Understand these instructions.  Will watch your condition.  Will get help right away if you are not doing well or get worse. Document Released: 10/14/2004 Document Revised: 03/29/2011 Document Reviewed: 12/06/2008 [facility] Patient Information 2014 [place], [place].  Sinusitis Sinusitis is redness, soreness, and swelling (inflammation) of the paranasal sinuses. Paranasal sinuses are air pockets within the bones of your face (beneath the eyes, the middle of the forehead, or above the eyes). In healthy paranasal sinuses, mucus is able to drain out, and air is able to circulate through them by way of your nose. However, when your paranasal sinuses are inflamed, mucus and air can become trapped. This can allow bacteria and other germs to grow and cause infection. Sinusitis can develop quickly and last only a short time (acute) or continue over a long period (chronic). Sinusitis that lasts for more than 12 weeks is considered chronic.  CAUSES  Causes of sinusitis include:  Allergies.  Structural  abnormalities, such as displacement of the cartilage that separates your nostrils (deviated septum), which can decrease the air flow through your nose and sinuses and affect sinus drainage.  Functional abnormalities, such as when the small hairs (cilia) that line your sinuses and help remove mucus do not work properly or are not present. SYMPTOMS    Symptoms of acute and chronic sinusitis are the same. The primary symptoms are pain and pressure around the affected sinuses. Other symptoms include:  Upper toothache.  Earache.  Headache.  Bad breath.  Decreased sense of smell and taste.  A cough, which worsens when you are lying flat.  Fatigue.  Fever.  Thick drainage from your nose, which often is green and may contain pus (purulent).  Swelling and warmth over the affected sinuses. DIAGNOSIS  Your caregiver will perform a physical exam. During the exam, your caregiver may:  Look in your nose for signs of abnormal growths in your nostrils (nasal polyps).  Tap over the affected sinus to check for signs of infection.  View the inside of your sinuses (endoscopy) with a special imaging device with a light attached (endoscope), which is inserted into your sinuses. If your caregiver suspects that you have chronic sinusitis, one or more of the following tests may be recommended:  Allergy tests.  Nasal culture A sample of mucus is taken from your nose and sent to a lab and screened for bacteria.  Nasal cytology A sample of mucus is taken from your nose and examined by your caregiver to determine if your sinusitis is related to an allergy. TREATMENT  Most cases of acute sinusitis are related to a viral infection and will resolve on their own within 10 days. Sometimes medicines are prescribed to help relieve symptoms (pain medicine, decongestants, nasal steroid sprays, or saline sprays).  However, for sinusitis related to a bacterial infection, your caregiver will prescribe antibiotic medicines. These are medicines that will help kill the bacteria causing the infection.  Rarely, sinusitis is caused by a fungal infection. In theses cases, your caregiver will prescribe antifungal medicine. For some cases of chronic sinusitis, surgery is needed. Generally, these are cases in which sinusitis recurs more than 3 times per year, despite  other treatments. HOME CARE INSTRUCTIONS   Drink plenty of water. Water helps thin the mucus so your sinuses can drain more easily.  Use a humidifier.  Inhale steam 3 to 4 times a day (for example, sit in the bathroom with the shower running).  Apply a warm, moist washcloth to your face 3 to 4 times a day, or as directed by your caregiver.  Use saline nasal sprays to help moisten and clean your sinuses.  Take over-the-counter or prescription medicines for pain, discomfort, or fever only as directed by your caregiver. SEEK IMMEDIATE MEDICAL CARE IF:  You have increasing pain or severe headaches.  You have nausea, vomiting, or drowsiness.  You have swelling around your face.  You have vision problems.  You have a stiff neck.  You have difficulty breathing. MAKE SURE YOU:   Understand these instructions.  Will watch your condition.  Will get help right away if you are not doing well or get worse. Document Released: 01/04/2005 Document Revised: 03/29/2011 Document Reviewed: 01/19/2011 [facility] Patient Information 2014 [place], [place].

## 2013-04-01 NOTE — ED Notes (Signed)
Pt states hx of nasal congestion for 1 week with productive cough and yellow blood tinged sputum, blood is dark red.

## 2013-06-26 ENCOUNTER — Other Ambulatory Visit (HOSPITAL_COMMUNITY): Payer: Self-pay | Admitting: Otolaryngology

## 2013-06-26 ENCOUNTER — Ambulatory Visit (HOSPITAL_COMMUNITY)
Admission: RE | Admit: 2013-06-26 | Discharge: 2013-06-26 | Disposition: A | Discharge status: Home / Self Care | Payer: Medicare Other | Source: Ambulatory Visit | Attending: Otolaryngology | Admitting: Otolaryngology

## 2013-06-26 DIAGNOSIS — Z85819 Personal history of malignant neoplasm of unspecified site of lip, oral cavity, and pharynx: Secondary | ICD-10-CM

## 2013-06-26 DIAGNOSIS — R0789 Other chest pain: Secondary | ICD-10-CM | POA: Insufficient documentation

## 2013-06-26 DIAGNOSIS — R634 Abnormal weight loss: Secondary | ICD-10-CM | POA: Insufficient documentation

## 2013-07-24 ENCOUNTER — Encounter (HOSPITAL_COMMUNITY): Payer: Self-pay | Admitting: Emergency Medicine

## 2013-07-24 ENCOUNTER — Emergency Department (HOSPITAL_COMMUNITY)
Admission: EM | Admit: 2013-07-24 | Discharge: 2013-07-24 | Disposition: A | Discharge status: Home / Self Care | Payer: Medicare Other | Attending: Emergency Medicine | Admitting: Emergency Medicine

## 2013-07-24 DIAGNOSIS — Z8589 Personal history of malignant neoplasm of other organs and systems: Secondary | ICD-10-CM | POA: Insufficient documentation

## 2013-07-24 DIAGNOSIS — J45909 Unspecified asthma, uncomplicated: Secondary | ICD-10-CM | POA: Insufficient documentation

## 2013-07-24 DIAGNOSIS — Z87891 Personal history of nicotine dependence: Secondary | ICD-10-CM | POA: Insufficient documentation

## 2013-07-24 DIAGNOSIS — IMO0002 Reserved for concepts with insufficient information to code with codable children: Secondary | ICD-10-CM | POA: Diagnosis not present

## 2013-07-24 DIAGNOSIS — B009 Herpesviral infection, unspecified: Secondary | ICD-10-CM | POA: Insufficient documentation

## 2013-07-24 DIAGNOSIS — K1370 Unspecified lesions of oral mucosa: Secondary | ICD-10-CM | POA: Diagnosis present

## 2013-07-24 DIAGNOSIS — B001 Herpesviral vesicular dermatitis: Secondary | ICD-10-CM

## 2013-07-24 DIAGNOSIS — Z79899 Other long term (current) drug therapy: Secondary | ICD-10-CM | POA: Diagnosis not present

## 2013-07-24 NOTE — ED Notes (Addendum)
Lower lip bleeding,for last 2 hours. Has been bleeding for past week intermittenly, says he had a fever blister there.sl. Oozing of blood, that pt pats with a towel.  Given a telfa,, 4x4 and ice pack

## 2013-07-24 NOTE — Discharge Instructions (Signed)
There is no bleeding at the site of the fever blister at this time. If bleeding should recur, please apply pressure with a ice pack. Apply pressure for 5-10 minutes by the clock. If bleeding continues please see Dr. Legrand Rams or return to the emergency department.

## 2013-07-24 NOTE — ED Provider Notes (Signed)
Medical screening examination/treatment/procedure(s) were performed by non-physician practitioner and as supervising physician I was immediately available for consultation/collaboration.   EKG Interpretation None      Rolland Porter, MD, Abram Sander   Janice Norrie, MD 07/24/13 2042

## 2013-07-24 NOTE — ED Provider Notes (Signed)
CSN: [id number]     Arrival date & time 07/24/13  1652 History   First MD Initiated Contact with Patient 07/24/13 1827     No chief complaint on file.    (Consider location/radiation/quality/duration/timing/severity/associated sxs/prior Treatment) HPI Comments: Patient presents to the emergency department with a complaint of bleeding from his lower lip. The patient states this is been going on for nearly a week. The patient states that when it starts to bleed it may bleed for some extended period time. Patient states the bleeding mostly comes from an area that looks like a" fever blister". He also noticed some mild swelling of the lower lip. No injury reported. The patient has no history of bleeding disorder.  The history is provided by the patient.    Past Medical History  Diagnosis Date  . Asthma   . Cancer    Past Surgical History  Procedure Laterality Date  . Cholecystectomy    . 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 No pertinent family history. History  Substance Use Topics  . Smoking status: Former Smoker    Types: Cigarettes  . Smokeless tobacco: Not on file  . Alcohol Use: Yes     Comment: daily    Review of Systems  Constitutional: Negative for activity change.       All ROS Neg except as noted in HPI  HENT: Negative.   Eyes: Negative for photophobia and discharge.  Respiratory: Negative for cough, shortness of breath and wheezing.   Cardiovascular: Negative for chest pain and palpitations.  Gastrointestinal: Negative for abdominal pain and blood in stool.  Genitourinary: Negative for dysuria, frequency and hematuria.  Musculoskeletal: Negative for arthralgias, back pain and neck pain.  Skin: Negative.   Neurological: Negative for dizziness, seizures and speech difficulty.  Psychiatric/Behavioral: Negative for hallucinations and confusion.      Allergies  Review of patient's allergies indicates no known allergies.  Home Medications    Prior to Admission medications   Medication Sig Start Date End Date Taking? Authorizing Provider  albuterol (PROVENTIL HFA;VENTOLIN HFA) 108 (90 BASE) MCG/ACT inhaler Inhale 2 puffs into the lungs 4 (four) times daily.    Yes Historical Provider, MD  fluticasone (FLONASE) 50 MCG/ACT nasal spray Place 1 spray into both nostrils 2 (two) times daily.   Yes Historical Provider, MD  gabapentin (NEURONTIN) 600 MG tablet Take 600 mg by mouth 3 (three) times daily.     Yes Historical Provider, MD  HYDROcodone-acetaminophen (NORCO/VICODIN) 5-325 MG per tablet Take 1 tablet by mouth 2 (two) times daily as needed for moderate pain.   Yes Historical Provider, MD  omeprazole (PRILOSEC) 40 MG capsule Take 40 mg by mouth daily.     Yes Historical Provider, MD   BP 142/92  Pulse 115  Temp(Src) 98.6 F (37 C) (Oral)  Resp 18  Ht 5\' 10"  (1.778 m)  Wt 178 lb (80.74 kg)  BMI 25.54 kg/m2  SpO2 96% Physical Exam  Nursing note and vitals reviewed. Constitutional: He is oriented to person, place, and time. He appears well-developed and well-nourished.  Non-toxic appearance.  HENT:  Head: Normocephalic.  Right Ear: Tympanic membrane and external ear normal.  Left Ear: Tympanic membrane and external ear normal.  Small scab area of the lower lip. No bleeding active at this time. There is no bleeding from the mucosa of the lower lip. There is mild swelling of the lower lip on the left, no red streaks appreciated.  Eyes: EOM  and lids are normal. Pupils are equal, round, and reactive to light.  Neck: Normal range of motion. Neck supple. Carotid bruit is not present.  Cardiovascular: Normal rate, regular rhythm, normal heart sounds, intact distal pulses and normal pulses.   Pulmonary/Chest: Breath sounds normal. No respiratory distress.  Abdominal: Soft. Bowel sounds are normal. There is no tenderness. There is no guarding.  Musculoskeletal: Normal range of motion.  Lymphadenopathy:       Head (right side): No  submandibular adenopathy present.       Head (left side): No submandibular adenopathy present.    He has no cervical adenopathy.  Neurological: He is alert and oriented to person, place, and time. He has normal strength. No cranial nerve deficit or sensory deficit.  Skin: Skin is warm and dry.  Psychiatric: He has a normal mood and affect. His speech is normal.    ED Course  Procedures (including critical care time) Labs Review Labs Reviewed - No data to display  Imaging Review No results found.   EKG Interpretation None      MDM No bleeding noted of the lower lip at this time. Patient advised that if the bleeding should recur to apply pressure to the bleeding area with an ice pack and Tylenol. He is advised to return to the emergency department if any changes, problems, or concerns.    Final diagnoses:  Fever blister    **I have reviewed nursing notes, vital signs, and all appropriate lab and imaging results for this patient.Lenox Ahr, PA-C 07/24/13 1845

## 2013-09-17 ENCOUNTER — Encounter (INDEPENDENT_AMBULATORY_CARE_PROVIDER_SITE_OTHER): Payer: Self-pay

## 2013-09-19 IMAGING — CR DG CHEST 2V
2 series · 2 of 2 positions shown · non-contrast
Comparison: 01/22/2010.

CLINICAL DATA: Cough and congestion for 2 days.  History of throat
cancer.

CHEST - 2 VIEW

[view not recorded (1 of 2)]
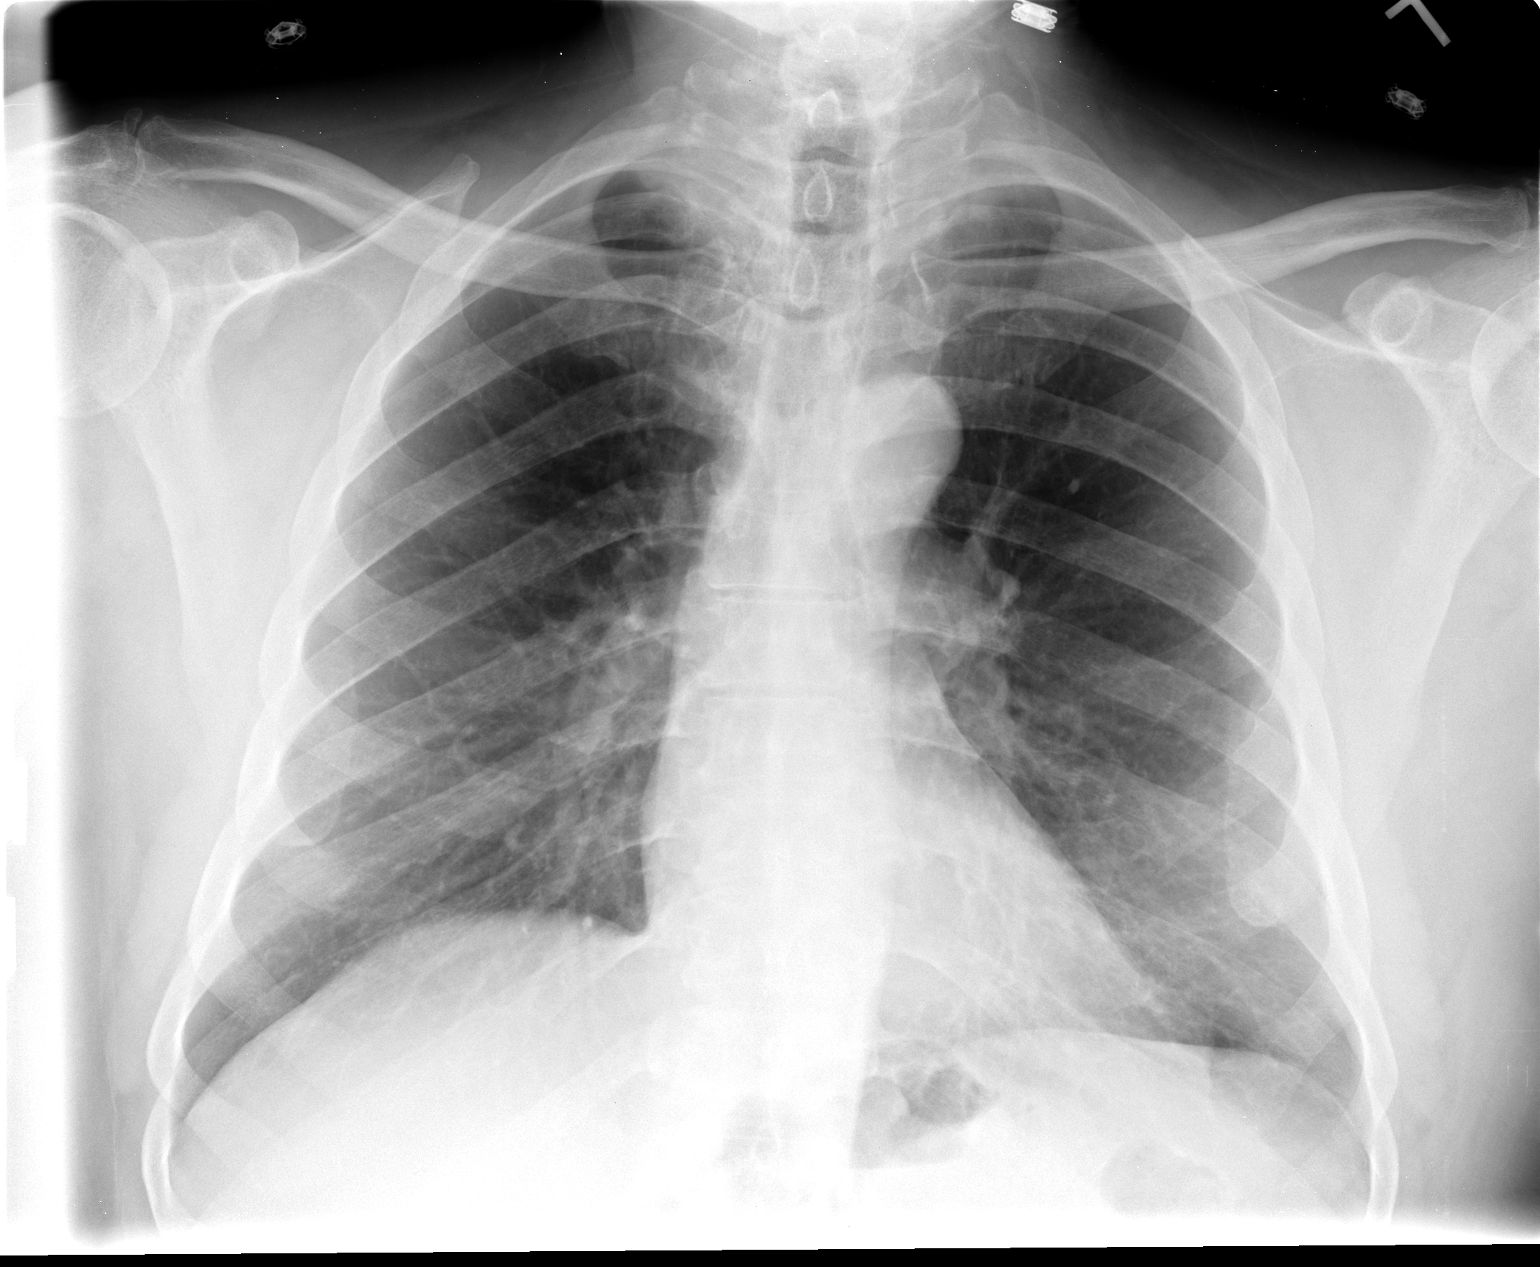

[view not recorded (2 of 2)]
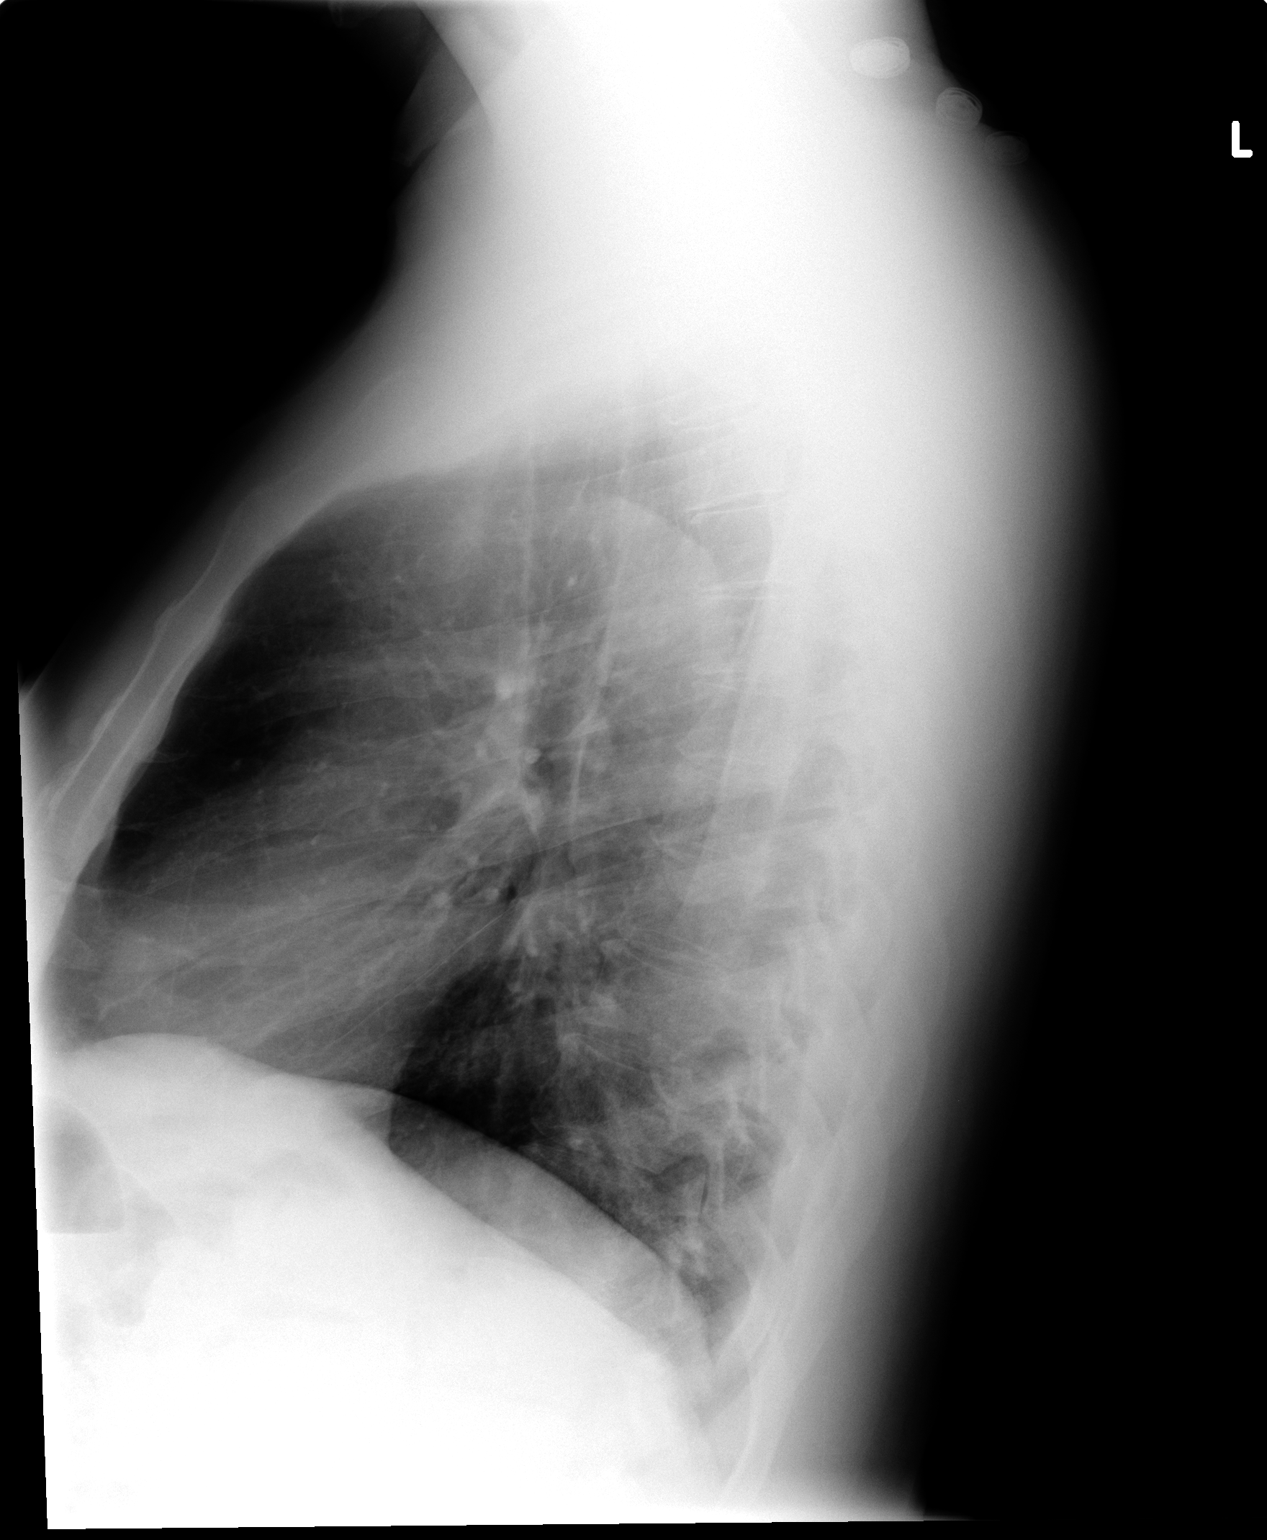

[2 of 2 positions shown; findings below may reference images not displayed]

FINDINGS: Port-A-Cath has been removed.  Heart size and mediastinal
contours are stable.  There is stable biapical pleural thickening.
The lungs are clear.  There is no pleural effusion.  No osseous
abnormalities are identified.
IMPRESSION: Stable examination.  No active cardiopulmonary process.

## 2014-01-16 ENCOUNTER — Ambulatory Visit (HOSPITAL_COMMUNITY)
Admission: RE | Admit: 2014-01-16 | Discharge: 2014-01-16 | Disposition: A | Discharge status: Home / Self Care | Payer: Medicare Other | Source: Ambulatory Visit | Attending: Internal Medicine | Admitting: Internal Medicine

## 2014-01-16 ENCOUNTER — Other Ambulatory Visit (HOSPITAL_COMMUNITY): Payer: Self-pay | Admitting: Internal Medicine

## 2014-01-16 DIAGNOSIS — J40 Bronchitis, not specified as acute or chronic: Secondary | ICD-10-CM | POA: Diagnosis not present

## 2014-01-16 DIAGNOSIS — I771 Stricture of artery: Secondary | ICD-10-CM | POA: Insufficient documentation

## 2014-01-16 DIAGNOSIS — R05 Cough: Secondary | ICD-10-CM | POA: Diagnosis present

## 2014-01-16 DIAGNOSIS — R0989 Other specified symptoms and signs involving the circulatory and respiratory systems: Secondary | ICD-10-CM | POA: Insufficient documentation

## 2014-02-21 ENCOUNTER — Other Ambulatory Visit (HOSPITAL_COMMUNITY): Payer: Self-pay | Admitting: Internal Medicine

## 2014-02-21 DIAGNOSIS — R188 Other ascites: Secondary | ICD-10-CM

## 2014-02-25 ENCOUNTER — Other Ambulatory Visit (HOSPITAL_COMMUNITY): Payer: Self-pay | Admitting: Internal Medicine

## 2014-02-25 ENCOUNTER — Ambulatory Visit (HOSPITAL_COMMUNITY)
Admission: RE | Admit: 2014-02-25 | Discharge: 2014-02-25 | Disposition: A | Discharge status: Home / Self Care | Payer: Medicare Other | Source: Ambulatory Visit | Attending: Internal Medicine | Admitting: Internal Medicine

## 2014-02-25 DIAGNOSIS — R188 Other ascites: Secondary | ICD-10-CM

## 2014-02-25 DIAGNOSIS — R140 Abdominal distension (gaseous): Secondary | ICD-10-CM | POA: Diagnosis not present

## 2014-02-25 DIAGNOSIS — R634 Abnormal weight loss: Secondary | ICD-10-CM | POA: Insufficient documentation

## 2014-03-02 ENCOUNTER — Emergency Department (HOSPITAL_COMMUNITY): Payer: Medicare Other

## 2014-03-02 ENCOUNTER — Encounter (HOSPITAL_COMMUNITY): Payer: Self-pay | Admitting: *Deleted

## 2014-03-02 ENCOUNTER — Emergency Department (HOSPITAL_COMMUNITY)
Admission: EM | Admit: 2014-03-02 | Discharge: 2014-03-03 | Disposition: A | Discharge status: Home / Self Care | Payer: Medicare Other | Attending: Emergency Medicine | Admitting: Emergency Medicine

## 2014-03-02 DIAGNOSIS — Z79899 Other long term (current) drug therapy: Secondary | ICD-10-CM | POA: Diagnosis not present

## 2014-03-02 DIAGNOSIS — Z859 Personal history of malignant neoplasm, unspecified: Secondary | ICD-10-CM | POA: Diagnosis not present

## 2014-03-02 DIAGNOSIS — Z87891 Personal history of nicotine dependence: Secondary | ICD-10-CM | POA: Insufficient documentation

## 2014-03-02 DIAGNOSIS — R609 Edema, unspecified: Secondary | ICD-10-CM | POA: Diagnosis not present

## 2014-03-02 DIAGNOSIS — M7989 Other specified soft tissue disorders: Secondary | ICD-10-CM | POA: Diagnosis present

## 2014-03-02 DIAGNOSIS — Z7951 Long term (current) use of inhaled steroids: Secondary | ICD-10-CM | POA: Diagnosis not present

## 2014-03-02 DIAGNOSIS — E785 Hyperlipidemia, unspecified: Secondary | ICD-10-CM | POA: Diagnosis not present

## 2014-03-02 DIAGNOSIS — J45909 Unspecified asthma, uncomplicated: Secondary | ICD-10-CM | POA: Diagnosis not present

## 2014-03-02 DIAGNOSIS — R600 Localized edema: Secondary | ICD-10-CM

## 2014-03-02 DIAGNOSIS — R079 Chest pain, unspecified: Secondary | ICD-10-CM | POA: Diagnosis not present

## 2014-03-02 HISTORY — DX: Essential (primary) hypertension: I10

## 2014-03-02 HISTORY — DX: Hyperlipidemia, unspecified: E78.5

## 2014-03-02 LAB — CBC WITH DIFFERENTIAL/PLATELET
BASOS ABS: 0 10*3/uL (ref 0.0–0.1)
Basophils Relative: 1 % (ref 0–1)
EOS ABS: 0.1 10*3/uL (ref 0.0–0.7)
Eosinophils Relative: 3 % (ref 0–5)
HEMATOCRIT: 31.5 % — AB (ref 39.0–52.0)
HEMOGLOBIN: 10.5 g/dL — AB (ref 13.0–17.0)
Lymphocytes Relative: 37 % (ref 12–46)
Lymphs Abs: 1.4 10*3/uL (ref 0.7–4.0)
MCH: 32.6 pg (ref 26.0–34.0)
MCHC: 33.3 g/dL (ref 30.0–36.0)
MCV: 97.8 fL (ref 78.0–100.0)
Monocytes Absolute: 0.6 10*3/uL (ref 0.1–1.0)
Monocytes Relative: 15 % — ABNORMAL HIGH (ref 3–12)
NEUTROS ABS: 1.7 10*3/uL (ref 1.7–7.7)
NEUTROS PCT: 44 % (ref 43–77)
PLATELETS: 83 10*3/uL — AB (ref 150–400)
RBC: 3.22 MIL/uL — AB (ref 4.22–5.81)
RDW: 17.3 % — AB (ref 11.5–15.5)
WBC: 3.9 10*3/uL — AB (ref 4.0–10.5)

## 2014-03-02 LAB — TROPONIN I

## 2014-03-02 LAB — BRAIN NATRIURETIC PEPTIDE: B Natriuretic Peptide: 27 pg/mL (ref 0.0–100.0)

## 2014-03-02 LAB — BASIC METABOLIC PANEL
Anion gap: 2 — ABNORMAL LOW (ref 5–15)
BUN: 9 mg/dL (ref 6–23)
CHLORIDE: 106 mmol/L (ref 96–112)
CO2: 27 mmol/L (ref 19–32)
Calcium: 8.4 mg/dL (ref 8.4–10.5)
Creatinine, Ser: 0.78 mg/dL (ref 0.50–1.35)
GFR calc Af Amer: >90 mL/min (ref >90)
GLUCOSE: 127 mg/dL — AB (ref 70–99)
Potassium: 3.8 mmol/L (ref 3.5–5.1)
Sodium: 135 mmol/L (ref 135–145)

## 2014-03-02 MED ORDER — FUROSEMIDE 20 MG PO TABS: 20.0000 mg | ORAL_TABLET | Freq: Every day | ORAL | Status: DC

## 2014-03-02 MED ORDER — FUROSEMIDE 40 MG PO TABS
40.0000 mg | ORAL_TABLET | Freq: Once | ORAL | Status: AC
  Administered 2014-03-02: 40 mg via ORAL
  Filled 2014-03-02: qty 1

## 2014-03-02 NOTE — ED Notes (Signed)
Patient placed on cardiac monitor and EKG done and given to EDP

## 2014-03-02 NOTE — Discharge Instructions (Signed)
Takes fluid pills daily in the morning. Elevate legs. Reduce salt in your diet. Follow-up your primary care doctor next week

## 2014-03-02 NOTE — ED Provider Notes (Signed)
CSN: [id number]     Arrival date & time 03/02/14  1819 History  This chart was scribed for Nat Christen, MD by Stephania Fragmin, ED Scribe. This patient was seen in room APA18/APA18 and the patient's care was started at 7:00 PM.      Chief Complaint  Patient presents with  . Leg Swelling   The history is provided by the patient. No language interpreter was used.     HPI Comments: [name] is a 59 y.o. male with a history of hypertension and hyperlipidemia who presents to the Emergency Department complaining of bilateral leg swelling that began last Sunday.  Patient states he also feels a little short of breath, and has associated leg pain that is worsened by ambulating. He had similar symptoms in the past, but he reports today's episode is more persistent. He takes Neurontin for chronic burning leg pain. Patient denies a history of CHF, MI, or DM. He also denies chest pain. Patient lives alone. Dr. Legrand Rams is his PCP.   Past Medical History  Diagnosis Date  . Asthma   . Cancer   . Hypertension   . Hyperlipidemia    Past Surgical History  Procedure Laterality Date  . Cholecystectomy    . Surgery to remove cancer from neck     No family history on file. History  Substance Use Topics  . Smoking status: Former Smoker    Types: Cigarettes  . Smokeless tobacco: Not on file  . Alcohol Use: Yes     Comment: daily    Review of Systems  Cardiovascular: Positive for leg swelling.  All other systems reviewed and are negative.     Allergies  Review of patient's allergies indicates no known allergies.  Home Medications   Prior to Admission medications   Medication Sig Start Date End Date Taking? Authorizing Provider  albuterol (PROVENTIL HFA;VENTOLIN HFA) 108 (90 BASE) MCG/ACT inhaler Inhale 2 puffs into the lungs 4 (four) times daily.    Yes Historical Provider, MD  amLODipine (NORVASC) 5 MG tablet Take 5 mg by mouth daily. 03/02/14  Yes Historical Provider, MD  atorvastatin  (LIPITOR) 10 MG tablet Take 10 mg by mouth daily. 02/02/14  Yes Historical Provider, MD  fluticasone (FLONASE) 50 MCG/ACT nasal spray Place 1 spray into both nostrils at bedtime as needed (Congestion).    Yes Historical Provider, MD  gabapentin (NEURONTIN) 600 MG tablet Take 600 mg by mouth 3 (three) times daily.     Yes Historical Provider, MD  hydroxypropyl methylcellulose / hypromellose (ISOPTO TEARS / GONIOVISC) 2.5 % ophthalmic solution Place 1 drop into both eyes daily as needed for dry eyes.   Yes Historical Provider, MD  omeprazole (PRILOSEC) 40 MG capsule Take 40 mg by mouth daily.     Yes Historical Provider, MD  furosemide (LASIX) 20 MG tablet Take 1 tablet (20 mg total) by mouth daily. 03/02/14   Nat Christen, MD   BP 112/78 mmHg  Pulse 70  Temp(Src) 97.8 F (36.6 C) (Oral)  Resp 13  Ht 5\' 10"  (1.778 m)  Wt 195 lb (88.451 kg)  BMI 27.98 kg/m2  SpO2 97% Physical Exam  Constitutional: He is oriented to person, place, and time. He appears well-developed and well-nourished.  HENT:  Head: Normocephalic and atraumatic.  Eyes: Conjunctivae and EOM are normal. Pupils are equal, round, and reactive to light.  Neck: Normal range of motion. Neck supple.  Cardiovascular: Normal rate and regular rhythm.   Pulmonary/Chest: Effort normal and breath  sounds normal.  Abdominal: Soft. Bowel sounds are normal.  Musculoskeletal: Normal range of motion. He exhibits edema (Bilateral 3-4+ lower extremity edema.).  Neurological: He is alert and oriented to person, place, and time.  Skin: Skin is warm and dry.  Psychiatric: He has a normal mood and affect. His behavior is normal.  Nursing note and vitals reviewed.    ED Course  Procedures (including critical care time)  DIAGNOSTIC STUDIES: Oxygen Saturation is 100% on room air, normal by my interpretation.    COORDINATION OF CARE: 7:03 PM - Discussed treatment plan with pt at bedside which includes XR chest and blood tests, and pt agreed to  plan.   Labs Review Labs Reviewed  BASIC METABOLIC PANEL - Abnormal; Notable for the following:    Glucose, Bld 127 (*)    Anion gap 2 (*)    All other components within normal limits  CBC WITH DIFFERENTIAL/PLATELET - Abnormal; Notable for the following:    WBC 3.9 (*)    RBC 3.22 (*)    Hemoglobin 10.5 (*)    HCT 31.5 (*)    RDW 17.3 (*)    Platelets 83 (*)    Monocytes Relative 15 (*)    All other components within normal limits  BRAIN NATRIURETIC PEPTIDE  TROPONIN I    Imaging Review Dg Chest 2 View  03/02/2014   CLINICAL DATA:  Shortness of Breath for 1 week  EXAM: CHEST  2 VIEW  COMPARISON:  January 16, 2014  FINDINGS: Lungs are clear. Heart size and pulmonary vascularity are normal. No adenopathy. No bone lesions.  IMPRESSION: No edema or consolidation.   Electronically Signed   By: Lowella Grip III M.D.   On: 03/02/2014 20:24     EKG Interpretation   Date/Time:  Saturday March 02 2014 19:47:13 EST Ventricular Rate:  79 PR Interval:  212 QRS Duration: 76 QT Interval:  388 QTC Calculation: 445 R Axis:   52 Text Interpretation:  Sinus rhythm Prolonged PR interval Low voltage,  precordial leads Abnormal R-wave progression, early transition Minimal ST  elevation, inferior leads Baseline wander in lead(s) V1 Confirmed by Ciella Obi   MD, Linah Klapper ([id number]) on 03/02/2014 8:33:10 PM      MDM   Final diagnoses:  Chest pain  Bilateral leg edema   Patient has bilateral lower extremity edema. No chest pain. Questionable mild dyspnea. Hemoglobin low at 10.5. EKG shows no acute changes. Chest x-ray no edema. Will start Lasix. Patient has primary care follow-up  I personally performed the services described in this documentation, which was scribed in my presence. The recorded information has been reviewed and is accurate.   Nat Christen, MD 03/03/14 [phone]

## 2014-03-02 NOTE — ED Notes (Addendum)
Pt states swelling to bilateral began a little over a week ago. Swelling has since moved to upper legs. Pain to legs also, causing difficulty with ambulating.

## 2014-03-12 ENCOUNTER — Ambulatory Visit (INDEPENDENT_AMBULATORY_CARE_PROVIDER_SITE_OTHER): Payer: Medicare Other | Admitting: Internal Medicine

## 2014-03-12 ENCOUNTER — Encounter (INDEPENDENT_AMBULATORY_CARE_PROVIDER_SITE_OTHER): Payer: Self-pay | Admitting: Internal Medicine

## 2014-03-12 VITALS — BP 112/76 | HR 84 | Temp 98.0°F | Ht 71.0 in | Wt 181.9 lb

## 2014-03-12 DIAGNOSIS — K7030 Alcoholic cirrhosis of liver without ascites: Secondary | ICD-10-CM

## 2014-03-12 DIAGNOSIS — K709 Alcoholic liver disease, unspecified: Secondary | ICD-10-CM

## 2014-03-12 DIAGNOSIS — K219 Gastro-esophageal reflux disease without esophagitis: Secondary | ICD-10-CM

## 2014-03-12 DIAGNOSIS — I10 Essential (primary) hypertension: Secondary | ICD-10-CM | POA: Insufficient documentation

## 2014-03-12 DIAGNOSIS — F1010 Alcohol abuse, uncomplicated: Secondary | ICD-10-CM

## 2014-03-12 LAB — HEPATIC FUNCTION PANEL
ALBUMIN: 3.3 g/dL — AB (ref 3.5–5.2)
ALK PHOS: 160 U/L — AB (ref 39–117)
ALT: 18 U/L (ref 0–53)
AST: 53 U/L — AB (ref 0–37)
Bilirubin, Direct: 0.7 mg/dL — ABNORMAL HIGH (ref 0.0–0.3)
Indirect Bilirubin: 0.7 mg/dL (ref 0.2–1.2)
TOTAL PROTEIN: 8.8 g/dL — AB (ref 6.0–8.3)
Total Bilirubin: 1.4 mg/dL — ABNORMAL HIGH (ref 0.2–1.2)

## 2014-03-12 NOTE — Patient Instructions (Signed)
Blood work today. OV in 4 weeks.

## 2014-03-12 NOTE — Progress Notes (Signed)
   Subjective:    Patient ID: [name], male    DOB: October 31, 1955, 59 y.o.   MRN: [id number]  HPI Referred by Dr. Legrand Rams for alcoholic liver disease.  He tells me he has drank all his life.  At present he drinks about a quart of beer a day every day. He tells me he use to be a heavy drinker. 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 Surgery and underwent chemo and radiation.  Followed by [facility] in [place].  No tattoos. NO IV drug use Appetite is good. No weight loss. (At DR. Fanta's office 2/0/2016 weight was 194. Today his weight  182).  Usually has a BM about 1-2 times a day. No melena or BRRB.  02/23/2014 albumin 3.0, total bili 3.0, Direct 1.1, Indirect 1.9, ALP 243, AST 107. ALT 35, WBC 4.` RBC 3.56, H and H 11.6 and 34.6, MCV 97.3, platelet ct 53.   02/25/2014 [place] RUQ: abdominal distention, weight loss  FINDINGS: Four quadrant assessment of the abdomen was performed and reveals no significant ascites. The bladder is decompressed. No hydronephrosis is noted.  IMPRESSION: No evidence of ascites.  02/20/2007 Colonoscopy Dr. Laural Golden: average risk screening: A 65mm polyp ascending colon,. Small external hemorrhoids.' I was unable to locate biopsy.    Review of Systems Single, no children. Unemployed.  Past Medical History  Diagnosis Date  . Asthma   . Cancer   . Hypertension   . Hyperlipidemia     Past Surgical History  Procedure Laterality Date  . Cholecystectomy    . Surgery to remove cancer from neck      6 yrs ago    No Known Allergies  Current Outpatient Prescriptions on File Prior to Visit  Medication Sig Dispense Refill  . albuterol (PROVENTIL HFA;VENTOLIN HFA) 108 (90 BASE) MCG/ACT inhaler Inhale 2 puffs into the lungs 4 (four) times daily.     Marland Kitchen amLODipine (NORVASC) 5 MG tablet Take 5 mg by mouth daily.    Marland Kitchen gabapentin (NEURONTIN) 600 MG tablet Take 600 mg by mouth 3 (three) times daily.      Marland Kitchen omeprazole  (PRILOSEC) 40 MG capsule Take 40 mg by mouth daily.      . hydroxypropyl methylcellulose / hypromellose (ISOPTO TEARS / GONIOVISC) 2.5 % ophthalmic solution Place 1 drop into both eyes daily as needed for dry eyes.     No current facility-administered medications on file prior to visit.        Objective:   Physical Exam  Filed Vitals:   03/12/14 0924  Height: 5\' 11"  (1.803 m)  Weight: 181 lb 14.4 oz (82.509 kg)   Alert and oriented. Skin warm and dry. Oral mucosa is moist.   . Sclera icteric, conjunctivae is pink. Thyroid not enlarged. No cervical lymphadenopathy. Lungs clear. Heart regular rate and rhythm.  Abdomen is soft. Bowel sounds are positive. No hepatomegaly. No abdominal masses felt. No tenderness.  No edema to lower extremities  No asterixis         Assessment & Plan:  Alcoholic hepatitis. Patient must quit drinking. Smells of etoh in office.  Hepatic function, CBC, AFP. Hep C antibody. etoh level.  Weight loss? Etiology.  After labs are back. Patient will need a CT abdomen/pelvis with CM for weight loss.

## 2014-03-13 ENCOUNTER — Telehealth (INDEPENDENT_AMBULATORY_CARE_PROVIDER_SITE_OTHER): Payer: Self-pay | Admitting: Internal Medicine

## 2014-03-13 DIAGNOSIS — B1920 Unspecified viral hepatitis C without hepatic coma: Secondary | ICD-10-CM

## 2014-03-13 LAB — HEPATITIS C ANTIBODY: HCV AB: REACTIVE — AB

## 2014-03-13 LAB — ETHANOL: Alcohol, Ethyl (B): 119 mg/dL — ABNORMAL HIGH (ref 0–10)

## 2014-03-13 LAB — AFP TUMOR MARKER: AFP TUMOR MARKER: 13.2 ng/mL — AB (ref <6.1)

## 2014-03-13 NOTE — Telephone Encounter (Signed)
Am going to add a genotype.

## 2014-03-13 NOTE — Telephone Encounter (Signed)
Is going to need an CT abdomen/pelvis with CM after labs back.

## 2014-03-14 LAB — HEPATITIS C RNA QUANTITATIVE: HCV Quantitative: NOT DETECTED IU/mL (ref <15)

## 2014-03-15 ENCOUNTER — Telehealth (INDEPENDENT_AMBULATORY_CARE_PROVIDER_SITE_OTHER): Payer: Self-pay | Admitting: Internal Medicine

## 2014-03-15 ENCOUNTER — Encounter (INDEPENDENT_AMBULATORY_CARE_PROVIDER_SITE_OTHER): Payer: Self-pay

## 2014-03-15 ENCOUNTER — Encounter (INDEPENDENT_AMBULATORY_CARE_PROVIDER_SITE_OTHER): Payer: Self-pay | Admitting: *Deleted

## 2014-03-15 DIAGNOSIS — R634 Abnormal weight loss: Secondary | ICD-10-CM

## 2014-03-15 DIAGNOSIS — R772 Abnormality of alphafetoprotein: Secondary | ICD-10-CM

## 2014-03-15 NOTE — Telephone Encounter (Signed)
Results given to patient. He has an elevated AFP. Needs surveillance on his Liver. Patient also has positive Hep C antibody.

## 2014-03-15 NOTE — Telephone Encounter (Signed)
CT ordered. 

## 2014-03-15 NOTE — Telephone Encounter (Signed)
CT sch'd 03/20/14 @ 315 (300), patient aware

## 2014-03-20 ENCOUNTER — Ambulatory Visit (HOSPITAL_COMMUNITY)
Admission: RE | Admit: 2014-03-20 | Discharge: 2014-03-20 | Disposition: A | Discharge status: Home / Self Care | Payer: Medicare Other | Source: Ambulatory Visit | Attending: Internal Medicine | Admitting: Internal Medicine

## 2014-03-20 DIAGNOSIS — R634 Abnormal weight loss: Secondary | ICD-10-CM | POA: Insufficient documentation

## 2014-03-20 DIAGNOSIS — R772 Abnormality of alphafetoprotein: Secondary | ICD-10-CM | POA: Diagnosis not present

## 2014-03-20 DIAGNOSIS — R932 Abnormal findings on diagnostic imaging of liver and biliary tract: Secondary | ICD-10-CM | POA: Diagnosis not present

## 2014-03-20 MED ORDER — SODIUM CHLORIDE 0.9 % IJ SOLN
INTRAMUSCULAR | Status: AC
  Filled 2014-03-20: qty 750

## 2014-03-20 MED ORDER — SODIUM CHLORIDE 0.9 % IJ SOLN
INTRAMUSCULAR | Status: AC
  Filled 2014-03-20: qty 45

## 2014-03-20 MED ORDER — IOHEXOL 300 MG/ML  SOLN
100.0000 mL | Freq: Once | INTRAMUSCULAR | Status: AC | PRN
  Administered 2014-03-20: 100 mL via INTRAVENOUS

## 2014-03-21 ENCOUNTER — Encounter (INDEPENDENT_AMBULATORY_CARE_PROVIDER_SITE_OTHER): Payer: Self-pay | Admitting: *Deleted

## 2014-03-21 ENCOUNTER — Telehealth (INDEPENDENT_AMBULATORY_CARE_PROVIDER_SITE_OTHER): Payer: Self-pay | Admitting: Internal Medicine

## 2014-03-21 DIAGNOSIS — R160 Hepatomegaly, not elsewhere classified: Secondary | ICD-10-CM

## 2014-03-21 DIAGNOSIS — R748 Abnormal levels of other serum enzymes: Secondary | ICD-10-CM

## 2014-03-21 NOTE — Telephone Encounter (Signed)
resc'd to 03/26/14 at 8 pm

## 2014-03-21 NOTE — Telephone Encounter (Signed)
Can we get the MRI before the 10th of March. It looks like this patient has a liver cancer.

## 2014-03-21 NOTE — Telephone Encounter (Signed)
MRI sch'd 03/28/14 at 845, patient aware

## 2014-03-21 NOTE — Telephone Encounter (Signed)
MRI liver ordered

## 2014-03-26 ENCOUNTER — Ambulatory Visit (HOSPITAL_COMMUNITY)
Admission: RE | Admit: 2014-03-26 | Discharge: 2014-03-26 | Disposition: A | Discharge status: Home / Self Care | Payer: Medicare Other | Source: Ambulatory Visit | Attending: Internal Medicine | Admitting: Internal Medicine

## 2014-03-26 DIAGNOSIS — R932 Abnormal findings on diagnostic imaging of liver and biliary tract: Secondary | ICD-10-CM | POA: Insufficient documentation

## 2014-03-26 DIAGNOSIS — K7460 Unspecified cirrhosis of liver: Secondary | ICD-10-CM | POA: Insufficient documentation

## 2014-03-26 DIAGNOSIS — R748 Abnormal levels of other serum enzymes: Secondary | ICD-10-CM

## 2014-03-26 DIAGNOSIS — Z8589 Personal history of malignant neoplasm of other organs and systems: Secondary | ICD-10-CM | POA: Diagnosis not present

## 2014-03-26 DIAGNOSIS — R1010 Upper abdominal pain, unspecified: Secondary | ICD-10-CM | POA: Diagnosis present

## 2014-03-26 DIAGNOSIS — R160 Hepatomegaly, not elsewhere classified: Secondary | ICD-10-CM

## 2014-03-26 DIAGNOSIS — R634 Abnormal weight loss: Secondary | ICD-10-CM | POA: Insufficient documentation

## 2014-03-26 MED ORDER — GADOBENATE DIMEGLUMINE 529 MG/ML IV SOLN
14.0000 mL | Freq: Once | INTRAVENOUS | Status: AC | PRN
  Administered 2014-03-26: 14 mL via INTRAVENOUS

## 2014-03-28 ENCOUNTER — Other Ambulatory Visit (HOSPITAL_COMMUNITY): Payer: Medicare Other

## 2014-04-01 ENCOUNTER — Telehealth (INDEPENDENT_AMBULATORY_CARE_PROVIDER_SITE_OTHER): Payer: Self-pay | Admitting: Internal Medicine

## 2014-04-01 NOTE — Telephone Encounter (Signed)
Please send progress notes, labs, etc to Dr. Monica Martinez office

## 2014-04-01 NOTE — Telephone Encounter (Signed)
Referral & notes faxed to [facility], schedule from there will contact patient to schedule appt

## 2014-04-09 ENCOUNTER — Ambulatory Visit (INDEPENDENT_AMBULATORY_CARE_PROVIDER_SITE_OTHER): Payer: Medicare Other | Admitting: Internal Medicine

## 2014-06-07 ENCOUNTER — Other Ambulatory Visit (HOSPITAL_COMMUNITY): Payer: Self-pay | Admitting: Internal Medicine

## 2014-06-07 ENCOUNTER — Ambulatory Visit (HOSPITAL_COMMUNITY)
Admission: RE | Admit: 2014-06-07 | Discharge: 2014-06-07 | Disposition: A | Discharge status: Home / Self Care | Payer: Medicare Other | Source: Ambulatory Visit | Attending: Internal Medicine | Admitting: Internal Medicine

## 2014-06-07 DIAGNOSIS — M25552 Pain in left hip: Secondary | ICD-10-CM

## 2014-07-18 HISTORY — PX: HEPATIC ARTERY EMBOLIZATION: SHX1743

## 2014-08-13 ENCOUNTER — Emergency Department (HOSPITAL_COMMUNITY): Payer: Medicare Other

## 2014-08-13 ENCOUNTER — Emergency Department (HOSPITAL_COMMUNITY)
Admission: EM | Admit: 2014-08-13 | Discharge: 2014-08-13 | Disposition: A | Discharge status: Home / Self Care | Payer: Medicare Other | Attending: Emergency Medicine | Admitting: Emergency Medicine

## 2014-08-13 ENCOUNTER — Encounter (HOSPITAL_COMMUNITY): Payer: Self-pay | Admitting: Emergency Medicine

## 2014-08-13 DIAGNOSIS — Z859 Personal history of malignant neoplasm, unspecified: Secondary | ICD-10-CM | POA: Insufficient documentation

## 2014-08-13 DIAGNOSIS — J45909 Unspecified asthma, uncomplicated: Secondary | ICD-10-CM | POA: Diagnosis not present

## 2014-08-13 DIAGNOSIS — G8929 Other chronic pain: Secondary | ICD-10-CM

## 2014-08-13 DIAGNOSIS — I10 Essential (primary) hypertension: Secondary | ICD-10-CM | POA: Diagnosis not present

## 2014-08-13 DIAGNOSIS — Z8639 Personal history of other endocrine, nutritional and metabolic disease: Secondary | ICD-10-CM | POA: Insufficient documentation

## 2014-08-13 DIAGNOSIS — M25552 Pain in left hip: Secondary | ICD-10-CM | POA: Diagnosis present

## 2014-08-13 DIAGNOSIS — Z79899 Other long term (current) drug therapy: Secondary | ICD-10-CM | POA: Insufficient documentation

## 2014-08-13 DIAGNOSIS — Z87891 Personal history of nicotine dependence: Secondary | ICD-10-CM | POA: Diagnosis not present

## 2014-08-13 MED ORDER — HYDROMORPHONE HCL 1 MG/ML IJ SOLN
1.0000 mg | Freq: Once | INTRAMUSCULAR | Status: AC
  Administered 2014-08-13: 1 mg via INTRAMUSCULAR
  Filled 2014-08-13: qty 1

## 2014-08-13 MED ORDER — TRAMADOL HCL 50 MG PO TABS: 50.0000 mg | ORAL_TABLET | Freq: Four times a day (QID) | ORAL | Status: DC | PRN
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
## 2014-08-13 NOTE — Discharge Instructions (Signed)
Follow up with your md in 1-2 weeks. °

## 2014-08-13 NOTE — ED Notes (Signed)
Pt states that he has been having left hip/leg pain for over a month with no injury.  States having trouble walking with this.

## 2014-09-19 ENCOUNTER — Telehealth: Payer: Self-pay | Admitting: *Deleted

## 2014-09-19 NOTE — Telephone Encounter (Signed)
Abdomen & pelvis CD requested from [facility] today

## 2014-09-20 ENCOUNTER — Telehealth: Payer: Self-pay | Admitting: *Deleted

## 2014-09-20 NOTE — Telephone Encounter (Signed)
CD of abdomen & pelvis arrived from [facility]
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
## 2014-09-26 ENCOUNTER — Ambulatory Visit
Admission: RE | Admit: 2014-09-26 | Discharge: 2014-09-26 | Disposition: A | Discharge status: Home / Self Care | Payer: Medicare Other | Source: Ambulatory Visit | Attending: Radiation Oncology | Admitting: Radiation Oncology

## 2014-09-26 ENCOUNTER — Encounter: Payer: Self-pay | Admitting: Radiation Oncology

## 2014-09-26 VITALS — BP 113/77 | HR 71 | Temp 98.4°F | Ht 67.0 in | Wt 197.7 lb

## 2014-09-26 DIAGNOSIS — I10 Essential (primary) hypertension: Secondary | ICD-10-CM | POA: Diagnosis not present

## 2014-09-26 DIAGNOSIS — C7951 Secondary malignant neoplasm of bone: Secondary | ICD-10-CM | POA: Diagnosis present

## 2014-09-26 DIAGNOSIS — C228 Malignant neoplasm of liver, primary, unspecified as to type: Secondary | ICD-10-CM | POA: Diagnosis present

## 2014-09-26 DIAGNOSIS — E785 Hyperlipidemia, unspecified: Secondary | ICD-10-CM | POA: Diagnosis not present

## 2014-09-26 DIAGNOSIS — Z923 Personal history of irradiation: Secondary | ICD-10-CM | POA: Diagnosis not present

## 2014-09-26 DIAGNOSIS — Z8589 Personal history of malignant neoplasm of other organs and systems: Secondary | ICD-10-CM | POA: Diagnosis not present

## 2014-09-26 DIAGNOSIS — Z87891 Personal history of nicotine dependence: Secondary | ICD-10-CM | POA: Diagnosis not present

## 2014-09-26 DIAGNOSIS — C220 Liver cell carcinoma: Secondary | ICD-10-CM

## 2014-09-26 DIAGNOSIS — F1010 Alcohol abuse, uncomplicated: Secondary | ICD-10-CM | POA: Insufficient documentation

## 2014-09-26 DIAGNOSIS — Z510 Encounter for antineoplastic radiation therapy: Secondary | ICD-10-CM | POA: Insufficient documentation

## 2014-09-26 DIAGNOSIS — J45909 Unspecified asthma, uncomplicated: Secondary | ICD-10-CM | POA: Insufficient documentation

## 2014-09-26 HISTORY — DX: Malignant neoplasm of tonsil, unspecified: C09.9

## 2014-09-26 HISTORY — DX: Reserved for concepts with insufficient information to code with codable children: IMO0002

## 2014-09-26 HISTORY — DX: Malignant neoplasm of liver, not specified as primary or secondary: C22.9

## 2014-09-26 NOTE — Progress Notes (Signed)
Please see the Nurse Progress Note in the MD Initial Consult Encounter for this patient. 

## 2014-09-26 NOTE — Progress Notes (Signed)
- Radiation Oncology         [phone] ________________________________  Initial Outpatient Consultation  Name: [name] MRN: [id number]  Date: 09/26/2014  DOB: December 13, 1955  QT:[id number],FHLKTGY, MD  Tepper, Lowry Bowl, MD   REFERRING PHYSICIAN: Trey Paula, MD  DIAGNOSIS: Metastatic Hepatocellular Carcinoma  HISTORY OF PRESENT ILLNESS::[name] is a 59 y.o. male who presents with liver cancer with a new finding of metastatic disease spread to the left sacrum. He has a prior history of Stage IVa Right Tonsil Carcinoma in 2009 and was treated with radiosensitizing chemotherapy and radiation. In March 2016, he presented with elevated liver enzymes due to a history of cirrhosis secondary to alcohol use, he underwent an MRI in March 2016 that showed a 3 cm dominant mass with early enhancement and washout in segment IV. There was a 2 cm lesion medial and anterior to the dominant mass above with early enhancement and washout as well as a segment IVa 1 cm lesion with early enhancement and washout. Segment VIII enhancement without washout, likely represents a dysplastic nodule. Tumor thrombus was noted in the left hepatic vein. His AFP was elevated to 14.8. He was then referred to [facility] for hepatocellular carcinoma. MRI of the abdomen on 09/12/14 shows an increase of overall size of diffuse HCC in hepatic segment IV with increased tumor thrombus in the left hepatic vein extending into the IVC and possibly right atrium. Also of note was an enhancing left sacral lesion consistent with osseous metastatic disease. The patient presents to me for palliative radiation to the left sacral area.  PREVIOUS RADIATION THERAPY: Yes. 06/01/2007- 07/17/2007 in [place], [place]. Site/Dose: 68.1 Gy in 30 fractions for Stage IVa Right Tonsil Carcinoma. radioembolization with Theraspheres (Y90)  PAST MEDICAL HISTORY:  has a past medical history of Asthma; Cancer; Hypertension; Hyperlipidemia; Radiation (2009);  Tonsil cancer; and Liver cancer.    PAST SURGICAL HISTORY: Past Surgical History  Procedure Laterality Date  . Cholecystectomy    . Surgery to remove cancer from neck      6 yrs ago  . Hepatic artery embolization  07/18/14    [facility]    FAMILY HISTORY: Family history is unknown by patient.  SOCIAL HISTORY:  reports that he quit smoking about 7 years ago. His smoking use included Cigarettes. He has a 3.5 pack-year smoking history. He has never used smokeless tobacco. He reports that he drinks alcohol. He reports that he does not use illicit drugs.  ALLERGIES: Review of patient's allergies indicates no known allergies.  MEDICATIONS:  Current Outpatient Prescriptions  Medication Sig Dispense Refill  . albuterol (PROVENTIL HFA;VENTOLIN HFA) 108 (90 BASE) MCG/ACT inhaler Inhale 2 puffs into the lungs 4 (four) times daily.     Marland Kitchen amLODipine (NORVASC) 5 MG tablet Take 5 mg by mouth daily.    . furosemide (LASIX) 20 MG tablet Take 20 mg by mouth daily.    Marland Kitchen gabapentin (NEURONTIN) 600 MG tablet Take 600 mg by mouth 3 (three) times daily.      . hydroxypropyl methylcellulose / hypromellose (ISOPTO TEARS / GONIOVISC) 2.5 % ophthalmic solution Place 1 drop into both eyes daily as needed for dry eyes.    Marland Kitchen ibuprofen (ADVIL,MOTRIN) 200 MG tablet Take 200 mg by mouth every 6 (six) hours as needed.    Marland Kitchen omeprazole (PRILOSEC) 40 MG capsule Take 40 mg by mouth daily.      Marland Kitchen spironolactone (ALDACTONE) 25 MG tablet Take 25 mg by mouth 2 (two)  times daily.    . meloxicam (MOBIC) 15 MG tablet Take 15 mg by mouth daily.    Marland Kitchen SORAfenib (NEXAVAR) 200 MG tablet Take 400 mg by mouth.    . traMADol (ULTRAM) 50 MG tablet Take 1 tablet (50 mg total) by mouth every 6 (six) hours as needed. (Patient not taking: Reported on 09/26/2014) 20 tablet 0   No current facility-administered medications for this encounter.    REVIEW OF SYSTEMS:  A 15 point review of systems is documented in the electronic medical record. This was  obtained by the nursing staff. However, I reviewed this with the patient to discuss relevant findings and make appropriate changes.  Pertinent items are noted in HPI.   The patient reports left hip pain with radiation of pain into the left groin. The patient requires use of a cane in light of his pain for ambulation   PHYSICAL EXAM:  height is 5\' 7"  (1.702 m) and weight is 197 lb 11.2 oz (89.676 kg). His oral temperature is 98.4 F (36.9 C). His blood pressure is 113/77 and his pulse is 71. His oxygen saturation is 99%.   General: Alert and oriented, in no acute distress HEENT: Head is normocephalic. Extraocular movements are intact. Oropharynx is clear. Neck: Neck is supple, no palpable cervical or supraclavicular lymphadenopathy. Hyperpigmentation changes noted from previous radiation therapy with diffuse induration throughout the neck region Heart: Regular in rate and rhythm with no murmurs, rubs, or gallops. Chest: Clear to auscultation bilaterally, with no rhonchi, wheezes, or rales. Extremities: No cyanosis or edema. Lymphatics: see Neck Exam Skin: No concerning lesions. Musculoskeletal: symmetric strength and muscle tone throughout. Ambulatory with a cane. Left hip is mildly tender to palpation. Neurologic: Cranial nerves II through XII are grossly intact. No obvious focalities. Speech is fluent. Coordination is intact. Psychiatric: Judgment and insight are intact. Affect is appropriate.  ECOG = 1  LABORATORY DATA:  Lab Results  Component Value Date   WBC 3.9* 03/02/2014   HGB 10.5* 03/02/2014   HCT 31.5* 03/02/2014   MCV 97.8 03/02/2014   PLT 83* 03/02/2014   NEUTROABS 1.7 03/02/2014   Lab Results  Component Value Date   NA 135 03/02/2014   K 3.8 03/02/2014   CL 106 03/02/2014   CO2 27 03/02/2014   GLUCOSE 127* 03/02/2014   CREATININE 0.78 03/02/2014   CALCIUM 8.4 03/02/2014      RADIOGRAPHY: [facility] imaging reviewed    IMPRESSION: Metastatic Hepatocellular  Cancer. Pt would be a good candidate for palliative radiation to the left sacral area.  PLAN: The pt would like his radiation treatments to be at [facility] where he had his previous radiation treatment. Anticipate 10 palliative radiation treatments directed to the left sacral area. He would most likely undergo CT simulation at the [facility] on 09/30/14.  This document serves as a record of services personally performed by Gery Pray, MD. It was created on his behalf by Darcus Austin, a trained medical scribe. The creation of this record is based on the scribe's personal observations and the provider's statements to them. This document has been checked and approved by the attending provider.   ------------------------------------------------ Blair Promise, PhD, MD

## 2014-09-27 NOTE — Addendum Note (Signed)
Encounter addended by: Jacqulyn Liner, RN on: 09/27/2014 12:27 PM<BR>     Documentation filed: Charges VN

## 2015-02-21 ENCOUNTER — Encounter (HOSPITAL_COMMUNITY): Payer: Self-pay

## 2015-02-21 DIAGNOSIS — Z87891 Personal history of nicotine dependence: Secondary | ICD-10-CM | POA: Diagnosis not present

## 2015-02-21 DIAGNOSIS — I10 Essential (primary) hypertension: Secondary | ICD-10-CM | POA: Diagnosis not present

## 2015-02-21 DIAGNOSIS — J45909 Unspecified asthma, uncomplicated: Secondary | ICD-10-CM | POA: Diagnosis not present

## 2015-02-21 DIAGNOSIS — Z85818 Personal history of malignant neoplasm of other sites of lip, oral cavity, and pharynx: Secondary | ICD-10-CM | POA: Insufficient documentation

## 2015-02-21 DIAGNOSIS — Z791 Long term (current) use of non-steroidal anti-inflammatories (NSAID): Secondary | ICD-10-CM | POA: Diagnosis not present

## 2015-02-21 DIAGNOSIS — Z8505 Personal history of malignant neoplasm of liver: Secondary | ICD-10-CM | POA: Diagnosis not present

## 2015-02-21 DIAGNOSIS — Z79899 Other long term (current) drug therapy: Secondary | ICD-10-CM | POA: Insufficient documentation

## 2015-02-21 DIAGNOSIS — Z8639 Personal history of other endocrine, nutritional and metabolic disease: Secondary | ICD-10-CM | POA: Insufficient documentation

## 2015-02-21 DIAGNOSIS — M5416 Radiculopathy, lumbar region: Secondary | ICD-10-CM | POA: Diagnosis not present

## 2015-02-21 DIAGNOSIS — K148 Other diseases of tongue: Secondary | ICD-10-CM | POA: Diagnosis not present

## 2015-02-21 DIAGNOSIS — M545 Low back pain: Secondary | ICD-10-CM | POA: Diagnosis present

## 2015-02-21 NOTE — ED Notes (Signed)
Pt reports chronic pain in both legs for "a while"  States getting worse.   Pt also states his tongue is "shrinking on one side"  For the past 3 months.

## 2015-02-22 ENCOUNTER — Emergency Department (HOSPITAL_COMMUNITY): Payer: Medicare Other

## 2015-02-22 ENCOUNTER — Emergency Department (HOSPITAL_COMMUNITY)
Admission: EM | Admit: 2015-02-22 | Discharge: 2015-02-22 | Disposition: A | Discharge status: Home / Self Care | Payer: Medicare Other | Attending: Emergency Medicine | Admitting: Emergency Medicine

## 2015-02-22 DIAGNOSIS — M5416 Radiculopathy, lumbar region: Secondary | ICD-10-CM | POA: Diagnosis not present

## 2015-02-22 MED ORDER — PREDNISONE 20 MG PO TABS: 40.0000 mg | ORAL_TABLET | Freq: Every day | ORAL | Status: DC

## 2015-02-22 MED ORDER — HYDROCODONE-ACETAMINOPHEN 5-325 MG PO TABS: 2.0000 | ORAL_TABLET | ORAL | Status: AC | PRN

## 2015-02-22 MED ORDER — PREDNISONE 20 MG PO TABS: 40.0000 mg | ORAL_TABLET | Freq: Once | ORAL | Status: DC
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
## 2015-02-22 NOTE — Discharge Instructions (Signed)
Lumbosacral Radiculopathy °Lumbosacral radiculopathy is a condition that involves the spinal nerves and nerve roots in the low back and bottom of the spine. The condition develops when these nerves and nerve roots move out of place or become inflamed and cause symptoms. °CAUSES °This condition may be caused by: °· Pressure from a disk that bulges out of place (herniated disk). A disk is a plate of cartilage that separates bones in the spine. °· Disk degeneration. °· A narrowing of the bones of the lower back (spinal stenosis). °· A tumor. °· An infection. °· An injury that places sudden pressure on the disks that cushion the bones of your lower spine. °RISK FACTORS °This condition is more likely to develop in: °· Males aged 60-50 years. °· Females aged 50-60 years. °· People who lift improperly. °· People who are overweight or live a sedentary lifestyle. °· People who smoke. °· People who perform repetitive activities that strain the spine. °SYMPTOMS °Symptoms of this condition include: °· Pain that goes down from the back into the legs (sciatica). This is the most common symptom. The pain may be worse with sitting, coughing, or sneezing. °· Pain and numbness in the arms and legs. °· Muscle weakness. °· Tingling. °· Loss of bladder control or bowel control. °DIAGNOSIS °This condition is diagnosed with a physical exam and medical history. If the pain is lasting, you may have tests, such as: °· MRI scan. °· X-ray. °· CT scan. °· Myelogram. °· Nerve conduction study. °TREATMENT °This condition is often treated with: °· Hot packs and ice applied to affected areas. °· Stretches to improve flexibility. °· Exercises to strengthen back muscles. °· Physical therapy. °· Pain medicine. °· A steroid injection in the spine. °In some cases, no treatment is needed. If the condition is long-lasting (chronic), or if symptoms are severe, treatment may involve surgery or lifestyle changes, such as following a weight loss plan. °HOME  CARE INSTRUCTIONS °Medicines °· Take medicines only as directed by your health care provider. °· Do not drive or operate heavy machinery while taking pain medicine. °Injury Care °· Apply a heat pack to the injured area as directed by your health care provider. °· Apply ice to the affected area: °¨ Put ice in a plastic bag. °¨ Place a towel between your skin and the bag. °¨ Leave the ice on for 20-30 minutes, every 2 hours while you are awake or as needed. Or, leave the ice on for as long as directed by your health care provider. °Other Instructions °· If you were shown how to do any exercises or stretches, do them as directed by your health care provider. °· If your health care provider prescribed a diet or exercise program, follow it as directed. °· Keep all follow-up visits as directed by your health care provider. This is important. °SEEK MEDICAL CARE IF: °· Your pain does not improve over time even when taking pain medicines. °SEEK IMMEDIATE MEDICAL CARE IF: °· Your develop severe pain. °· Your pain suddenly gets worse. °· You develop increasing weakness in your legs. °· You lose the ability to control your bladder or bowel. °· You have difficulty walking or balancing. °· You have a fever. °  °This information is not intended to replace advice given to you by your health care provider. Make sure you discuss any questions you have with your health care provider. °  °Document Released: 01/04/2005 Document Revised: 05/21/2014 Document Reviewed: 12/31/2013 °Elsevier Interactive Patient Education ©2016 [employer] ° °

## 2015-04-14 ENCOUNTER — Emergency Department (HOSPITAL_COMMUNITY): Payer: Medicare Other

## 2015-04-14 ENCOUNTER — Inpatient Hospital Stay (HOSPITAL_COMMUNITY)
Admission: EM | Admit: 2015-04-14 | Discharge: 2015-04-18 | DRG: 392 | Disposition: A | Discharge status: Home / Self Care | Payer: Medicare Other | Attending: Internal Medicine | Admitting: Internal Medicine

## 2015-04-14 ENCOUNTER — Encounter (HOSPITAL_COMMUNITY): Payer: Self-pay | Admitting: Emergency Medicine

## 2015-04-14 DIAGNOSIS — Z9221 Personal history of antineoplastic chemotherapy: Secondary | ICD-10-CM

## 2015-04-14 DIAGNOSIS — F1010 Alcohol abuse, uncomplicated: Secondary | ICD-10-CM | POA: Diagnosis present

## 2015-04-14 DIAGNOSIS — K922 Gastrointestinal hemorrhage, unspecified: Secondary | ICD-10-CM

## 2015-04-14 DIAGNOSIS — K529 Noninfective gastroenteritis and colitis, unspecified: Principal | ICD-10-CM | POA: Diagnosis present

## 2015-04-14 DIAGNOSIS — J45909 Unspecified asthma, uncomplicated: Secondary | ICD-10-CM | POA: Diagnosis present

## 2015-04-14 DIAGNOSIS — Z923 Personal history of irradiation: Secondary | ICD-10-CM

## 2015-04-14 DIAGNOSIS — D899 Disorder involving the immune mechanism, unspecified: Secondary | ICD-10-CM | POA: Diagnosis present

## 2015-04-14 DIAGNOSIS — E785 Hyperlipidemia, unspecified: Secondary | ICD-10-CM | POA: Diagnosis present

## 2015-04-14 DIAGNOSIS — D638 Anemia in other chronic diseases classified elsewhere: Secondary | ICD-10-CM | POA: Diagnosis present

## 2015-04-14 DIAGNOSIS — C220 Liver cell carcinoma: Secondary | ICD-10-CM | POA: Diagnosis present

## 2015-04-14 DIAGNOSIS — I10 Essential (primary) hypertension: Secondary | ICD-10-CM | POA: Diagnosis present

## 2015-04-14 DIAGNOSIS — K625 Hemorrhage of anus and rectum: Secondary | ICD-10-CM | POA: Diagnosis present

## 2015-04-14 DIAGNOSIS — Z87891 Personal history of nicotine dependence: Secondary | ICD-10-CM

## 2015-04-14 DIAGNOSIS — Z85818 Personal history of malignant neoplasm of other sites of lip, oral cavity, and pharynx: Secondary | ICD-10-CM

## 2015-04-14 LAB — COMPREHENSIVE METABOLIC PANEL
ALK PHOS: 128 U/L — AB (ref 38–126)
ALT: 37 U/L (ref 17–63)
AST: 73 U/L — AB (ref 15–41)
Albumin: 2.6 g/dL — ABNORMAL LOW (ref 3.5–5.0)
Anion gap: 8 (ref 5–15)
BUN: 15 mg/dL (ref 6–20)
CALCIUM: 8.3 mg/dL — AB (ref 8.9–10.3)
CO2: 23 mmol/L (ref 22–32)
CREATININE: 0.96 mg/dL (ref 0.61–1.24)
Chloride: 104 mmol/L (ref 101–111)
GFR calc Af Amer: >60 mL/min (ref >60)
Glucose, Bld: 133 mg/dL — ABNORMAL HIGH (ref 65–99)
Potassium: 3.6 mmol/L (ref 3.5–5.1)
Sodium: 135 mmol/L (ref 135–145)
Total Bilirubin: 0.6 mg/dL (ref 0.3–1.2)
Total Protein: 7.5 g/dL (ref 6.5–8.1)

## 2015-04-14 LAB — CBC
HCT: 29.5 % — ABNORMAL LOW (ref 39.0–52.0)
Hemoglobin: 9.9 g/dL — ABNORMAL LOW (ref 13.0–17.0)
MCH: 28.4 pg (ref 26.0–34.0)
MCHC: 33.6 g/dL (ref 30.0–36.0)
MCV: 84.5 fL (ref 78.0–100.0)
PLATELETS: 123 10*3/uL — AB (ref 150–400)
RBC: 3.49 MIL/uL — AB (ref 4.22–5.81)
RDW: 15.3 % (ref 11.5–15.5)
WBC: 4.6 10*3/uL (ref 4.0–10.5)

## 2015-04-14 LAB — PROTIME-INR
INR: 2.49 — ABNORMAL HIGH (ref 0.00–1.49)
PROTHROMBIN TIME: 26.6 s — AB (ref 11.6–15.2)

## 2015-04-14 LAB — POC OCCULT BLOOD, ED: Fecal Occult Bld: POSITIVE — AB

## 2015-04-14 MED ORDER — IOHEXOL 300 MG/ML  SOLN
75.0000 mL | Freq: Once | INTRAMUSCULAR | Status: AC | PRN
  Administered 2015-04-14: 75 mL via INTRAVENOUS

## 2015-04-14 MED ORDER — MORPHINE SULFATE (PF) 4 MG/ML IV SOLN
4.0000 mg | Freq: Once | INTRAVENOUS | Status: AC
  Administered 2015-04-14: 4 mg via INTRAVENOUS
  Filled 2015-04-14: qty 1

## 2015-04-14 MED ORDER — ONDANSETRON HCL 4 MG/2ML IJ SOLN
4.0000 mg | Freq: Once | INTRAMUSCULAR | Status: AC
  Administered 2015-04-14: 4 mg via INTRAVENOUS
  Filled 2015-04-14: qty 2

## 2015-04-14 NOTE — ED Notes (Signed)
Pt is a liver cancer pt getting chemo, pt started having abdominal pain today and noticed dark stools

## 2015-04-14 NOTE — ED Provider Notes (Signed)
CSN: [id number]     Arrival date & time 04/14/15  2051 History   First MD Initiated Contact with Patient 04/14/15 2300     Chief Complaint  Patient presents with  . Rectal Bleeding     (Consider location/radiation/quality/duration/timing/severity/associated sxs/prior Treatment) HPI  A 60 year old male with a history of hepatocellular carcinoma who presents with abdominal pain and bloody stools. Onset of symptoms earlier today. He noted dark red stools. He states he has had Rita 4 stools today. Initially they were soft and now they are hard. He's currently on oral chemotherapy. He reports intermittent right upper quadrant pain that is currently 4 out of 10. Baseline he states that he is usually pain-free. He reports generalized fatigue. Denies any recent illnesses or fevers. Denies any nausea or vomiting. Denies any NSAID use. No history of rectal bleeding in the past.  Pt on Sorafenib and Xarelto.  Past Medical History  Diagnosis Date  . Asthma   . Cancer ([place])   . Hypertension   . Hyperlipidemia   . 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 Tonsil cancer ([place])   . Liver cancer [facility])    Past Surgical History  Procedure Laterality Date  . Cholecystectomy    . Surgery to remove cancer from neck      6 yrs ago  . Hepatic artery embolization  07/18/14    [facility]   Family History  Problem Relation Age of Onset  . Family history unknown: Yes   Social History  Substance Use Topics  . Smoking status: Former Smoker -- 0.25 packs/day for 14 years    Types: Cigarettes    Quit date: 01/19/2007  . Smokeless tobacco: Never Used     Comment: smokes very little.  . Alcohol Use: 0.0 oz/week    0 Standard drinks or equivalent per week     Comment: quit recently due to liver ca. Was drinking 1/2 pint per day.    Review of Systems  Constitutional: Positive for fatigue. Negative for fever.  Respiratory: Negative for shortness of breath.   Cardiovascular: Negative for chest pain.  Gastrointestinal:  Positive for abdominal pain and blood in stool. Negative for nausea, vomiting and diarrhea.  Genitourinary: Negative for dysuria.  Neurological: Negative for dizziness.  All other systems reviewed and are negative.     Allergies  Review of patient's allergies indicates no known allergies.  Home Medications   Prior to Admission medications   Medication Sig Start Date End Date Taking? Authorizing Provider  albuterol (PROVENTIL HFA;VENTOLIN HFA) 108 (90 BASE) MCG/ACT inhaler Inhale 2 puffs into the lungs 4 (four) times daily.    Yes Historical Provider, MD  gabapentin (NEURONTIN) 600 MG tablet Take 600 mg by mouth 3 (three) times daily.     Yes Historical Provider, MD  hydroxypropyl methylcellulose / hypromellose (ISOPTO TEARS / GONIOVISC) 2.5 % ophthalmic solution Place 1 drop into both eyes daily as needed for dry eyes.   Yes Historical Provider, MD  naproxen sodium (ALEVE) 220 MG tablet Take 220-440 mg by mouth daily as needed (for pain).   Yes Historical Provider, MD  omeprazole (PRILOSEC) 40 MG capsule Take 40 mg by mouth daily.     Yes Historical Provider, MD  rivaroxaban (XARELTO) 20 MG TABS tablet Take 20 mg by mouth every evening. 04/10/15 04/09/16 Yes Historical Provider, MD  SORAfenib (NEXAVAR) 200 MG tablet Take 400 mg by mouth 2 (two) times daily. 04/02/15 04/01/16 Yes Historical Provider, MD  spironolactone (ALDACTONE) 25 MG tablet  Take 25 mg by mouth 2 (two) times daily.   Yes Historical Provider, MD  amLODipine (NORVASC) 5 MG tablet Take 5 mg by mouth daily. 03/02/14   Historical Provider, MD  atorvastatin (LIPITOR) 10 MG tablet Take 10 mg by mouth every evening. 04/05/15   Historical Provider, MD  furosemide (LASIX) 20 MG tablet Take 20 mg by mouth daily. 07/27/14   Historical Provider, MD  HYDROcodone-acetaminophen (NORCO/VICODIN) 5-325 MG tablet Take 2 tablets by mouth every 4 (four) hours as needed for moderate pain. Patient not taking: Reported on 04/14/2015 02/22/15   Orpah Greek, MD  ibuprofen (ADVIL,MOTRIN) 200 MG tablet Take 200 mg by mouth every 6 (six) hours as needed.    Historical Provider, MD  meloxicam (MOBIC) 15 MG tablet Take 15 mg by mouth daily. 08/10/14   Historical Provider, MD  predniSONE (DELTASONE) 20 MG tablet Take 2 tablets (40 mg total) by mouth daily with breakfast. Patient not taking: Reported on 04/14/2015 02/22/15   Orpah Greek, MD   BP 137/82 mmHg  Pulse 74  Temp(Src) 98.9 F (37.2 C) (Oral)  Resp 18  Ht 5\' 9"  (1.753 m)  Wt 184 lb (83.462 kg)  BMI 27.16 kg/m2  SpO2 99% Physical Exam  Constitutional: He is oriented to person, place, and time. No distress.  Chronically ill-appearing  HENT:  Head: Normocephalic and atraumatic.  Mucous membranes dry  Cardiovascular: Normal rate, regular rhythm and normal heart sounds.   No murmur heard. Pulmonary/Chest: Effort normal and breath sounds normal. No respiratory distress. He has no wheezes.  Abdominal: Soft. Bowel sounds are normal. He exhibits distension. There is tenderness. There is no rebound and no guarding.  Right upper quadrant tenderness to palpation without rebound or guarding  Genitourinary:  Rectal exam performed by nursing. Per report maroon-colored stool with gross blood  Musculoskeletal: He exhibits no edema.  Neurological: He is alert and oriented to person, place, and time.  Skin: Skin is warm and dry.  Psychiatric: He has a normal mood and affect.  Nursing note and vitals reviewed.   ED Course  Procedures (including critical care time) Labs Review Labs Reviewed  COMPREHENSIVE METABOLIC PANEL - Abnormal; Notable for the following:    Glucose, Bld 133 (*)    Calcium 8.3 (*)    Albumin 2.6 (*)    AST 73 (*)    Alkaline Phosphatase 128 (*)    All other components within normal limits  CBC - Abnormal; Notable for the following:    RBC 3.49 (*)    Hemoglobin 9.9 (*)    HCT 29.5 (*)    Platelets 123 (*)    All other components within normal limits   PROTIME-INR - Abnormal; Notable for the following:    Prothrombin Time 26.6 (*)    INR 2.49 (*)    All other components within normal limits  POC OCCULT BLOOD, ED - Abnormal; Notable for the following:    Fecal Occult Bld POSITIVE (*)    All other components within normal limits  TYPE AND SCREEN    Imaging Review Ct Abdomen Pelvis W Contrast  04/15/2015  CLINICAL DATA:  Acute onset of generalized abdominal pain. Dark stools. Patient on chemotherapy for liver cancer. Initial encounter. EXAM: CT ABDOMEN AND PELVIS WITH CONTRAST TECHNIQUE: Multidetector CT imaging of the abdomen and pelvis was performed using the standard protocol following bolus administration of intravenous contrast. CONTRAST:  71mL OMNIPAQUE IOHEXOL 300 MG/ML  SOLN COMPARISON:  CT the abdomen and pelvis  performed 03/20/2014, and MRI of the abdomen performed 03/26/2014 FINDINGS: Worsening bilateral central airspace opacities and nodularity may reflect chronic infection. Metastatic disease cannot be entirely excluded, but is considered less likely given the appearance. A heterogeneous mass is noted occupying part of the central right hepatic lobe, measuring up to 8 cm in size. The underlying nodular contour of the liver raises concern for hepatic cirrhosis. The spleen is unremarkable in appearance. The patient is status post cholecystectomy, with clips noted at the gallbladder fossa. The pancreas and adrenal glands are unremarkable. The kidneys are unremarkable in appearance. There is no evidence of hydronephrosis. No renal or ureteral stones are seen. No perinephric stranding is appreciated. No free fluid is identified. The small bowel is unremarkable in appearance. The stomach is within normal limits. No acute vascular abnormalities are seen. Scattered calcification is noted along the abdominal aorta and its branches. The appendix is normal in caliber and contains air, without evidence of appendicitis. Mild wall thickening is noted  along the ascending colon, with mild surrounding soft tissue inflammation, concerning for mild infectious or inflammatory colitis. The remainder of the colon is unremarkable in appearance. The bladder is mildly distended and grossly unremarkable. The prostate remains normal in size. There is superior displacement of the right testis, noted along the right inguinal canal. Postoperative change is noted at the left inguinal region. No inguinal lymphadenopathy is seen. No acute osseous abnormalities are identified. Multilevel vacuum phenomenon is noted along the lumbar spine. IMPRESSION: 1. Mild wall thickening along the ascending colon, with mild surrounding soft inflammation, concerning for infectious or inflammatory colitis. 2. 8 cm heterogeneous mass again noted occupying the central right hepatic lobe, reflecting the patient's known malignancy. This is significantly increased from prior studies. Underlying changes of hepatic cirrhosis. 3. Worsening bilateral central airspace opacities and nodularity may reflect chronic worsening infection. The appearance is less concerning for metastatic disease. 4. Scattered calcification along the abdominal aorta and its branches. 5. Superior displacement of the right testis, noted along the right inguinal canal. 6. Degenerative change along the lumbar spine. Electronically Signed   By: Garald Balding M.D.   On: 04/15/2015 00:22   I have personally reviewed and evaluated these images and lab results as part of my medical decision-making.   EKG Interpretation None      MDM   Final diagnoses:  Colitis  Lower GI bleeding    Patient presents with abdominal pain and lower GI bleeding. He is nontoxic-appearing. Afebrile. Vital signs reassuring. Maroon-colored stools. He is not having significant diarrhea or bright red blood in the ER. Basic labwork obtained. INR elevated this is likely related to liver disease. Patient was given morphine for pain. CT scans concerning  for infectious colitis. Patient was given Cipro and Flagyl. Hemoglobin is 9.9. Hemoglobin last year 10.5.  Discussed with Dr. Shanon Brow. Will admit for further management.    Merryl Hacker, MD 04/15/15 [place]

## 2015-04-15 ENCOUNTER — Encounter (HOSPITAL_COMMUNITY): Payer: Self-pay | Admitting: General Practice

## 2015-04-15 DIAGNOSIS — C220 Liver cell carcinoma: Secondary | ICD-10-CM | POA: Diagnosis not present

## 2015-04-15 DIAGNOSIS — D638 Anemia in other chronic diseases classified elsewhere: Secondary | ICD-10-CM | POA: Diagnosis present

## 2015-04-15 DIAGNOSIS — K529 Noninfective gastroenteritis and colitis, unspecified: Secondary | ICD-10-CM | POA: Diagnosis present

## 2015-04-15 DIAGNOSIS — J45909 Unspecified asthma, uncomplicated: Secondary | ICD-10-CM | POA: Diagnosis present

## 2015-04-15 DIAGNOSIS — F1010 Alcohol abuse, uncomplicated: Secondary | ICD-10-CM | POA: Diagnosis present

## 2015-04-15 DIAGNOSIS — E785 Hyperlipidemia, unspecified: Secondary | ICD-10-CM | POA: Diagnosis present

## 2015-04-15 DIAGNOSIS — I10 Essential (primary) hypertension: Secondary | ICD-10-CM | POA: Insufficient documentation

## 2015-04-15 DIAGNOSIS — Z85818 Personal history of malignant neoplasm of other sites of lip, oral cavity, and pharynx: Secondary | ICD-10-CM | POA: Diagnosis not present

## 2015-04-15 DIAGNOSIS — K625 Hemorrhage of anus and rectum: Secondary | ICD-10-CM | POA: Diagnosis present

## 2015-04-15 DIAGNOSIS — D899 Disorder involving the immune mechanism, unspecified: Secondary | ICD-10-CM | POA: Diagnosis present

## 2015-04-15 DIAGNOSIS — Z9221 Personal history of antineoplastic chemotherapy: Secondary | ICD-10-CM | POA: Diagnosis not present

## 2015-04-15 DIAGNOSIS — Z923 Personal history of irradiation: Secondary | ICD-10-CM | POA: Diagnosis not present

## 2015-04-15 DIAGNOSIS — K922 Gastrointestinal hemorrhage, unspecified: Secondary | ICD-10-CM | POA: Insufficient documentation

## 2015-04-15 DIAGNOSIS — Z87891 Personal history of nicotine dependence: Secondary | ICD-10-CM | POA: Diagnosis not present

## 2015-04-15 LAB — BASIC METABOLIC PANEL
Anion gap: 6 (ref 5–15)
BUN: 12 mg/dL (ref 6–20)
CALCIUM: 7.7 mg/dL — AB (ref 8.9–10.3)
CHLORIDE: 108 mmol/L (ref 101–111)
CO2: 25 mmol/L (ref 22–32)
CREATININE: 0.78 mg/dL (ref 0.61–1.24)
Glucose, Bld: 100 mg/dL — ABNORMAL HIGH (ref 65–99)
Potassium: 3.4 mmol/L — ABNORMAL LOW (ref 3.5–5.1)
SODIUM: 139 mmol/L (ref 135–145)

## 2015-04-15 LAB — LACTIC ACID, PLASMA: LACTIC ACID, VENOUS: 1.3 mmol/L (ref 0.5–2.0)

## 2015-04-15 LAB — TYPE AND SCREEN
ABO/RH(D): A POS
ANTIBODY SCREEN: NEGATIVE

## 2015-04-15 MED ORDER — SORAFENIB TOSYLATE 200 MG PO TABS
400.0000 mg | ORAL_TABLET | Freq: Two times a day (BID) | ORAL | Status: DC
  Administered 2015-04-15 – 2015-04-18 (×5): 400 mg via ORAL
  Filled 2015-04-15: qty 2

## 2015-04-15 MED ORDER — PANTOPRAZOLE SODIUM 40 MG IV SOLR
40.0000 mg | Freq: Once | INTRAVENOUS | Status: AC
  Administered 2015-04-15: 40 mg via INTRAVENOUS
  Filled 2015-04-15: qty 40

## 2015-04-15 MED ORDER — SPIRONOLACTONE 25 MG PO TABS
25.0000 mg | ORAL_TABLET | Freq: Two times a day (BID) | ORAL | Status: DC
  Administered 2015-04-15 – 2015-04-18 (×6): 25 mg via ORAL
  Filled 2015-04-15 (×6): qty 1

## 2015-04-15 MED ORDER — ONDANSETRON HCL 4 MG/2ML IJ SOLN: 4.0000 mg | Freq: Four times a day (QID) | INTRAMUSCULAR | Status: DC | PRN

## 2015-04-15 MED ORDER — SODIUM CHLORIDE 0.9% FLUSH
3.0000 mL | Freq: Two times a day (BID) | INTRAVENOUS | Status: DC
  Administered 2015-04-15 – 2015-04-17 (×5): 3 mL via INTRAVENOUS

## 2015-04-15 MED ORDER — ONDANSETRON HCL 4 MG PO TABS: 4.0000 mg | ORAL_TABLET | Freq: Four times a day (QID) | ORAL | Status: DC | PRN

## 2015-04-15 MED ORDER — CIPROFLOXACIN IN D5W 400 MG/200ML IV SOLN
400.0000 mg | Freq: Once | INTRAVENOUS | Status: AC
  Administered 2015-04-15: 400 mg via INTRAVENOUS
  Filled 2015-04-15: qty 200

## 2015-04-15 MED ORDER — CIPROFLOXACIN IN D5W 400 MG/200ML IV SOLN
400.0000 mg | Freq: Two times a day (BID) | INTRAVENOUS | Status: DC
  Administered 2015-04-15 – 2015-04-17 (×5): 400 mg via INTRAVENOUS
  Filled 2015-04-15 (×5): qty 200

## 2015-04-15 MED ORDER — ALBUTEROL SULFATE (2.5 MG/3ML) 0.083% IN NEBU
2.5000 mg | INHALATION_SOLUTION | Freq: Four times a day (QID) | RESPIRATORY_TRACT | Status: DC
  Administered 2015-04-15 – 2015-04-18 (×7): 2.5 mg via RESPIRATORY_TRACT
  Filled 2015-04-15 (×7): qty 3

## 2015-04-15 MED ORDER — ATORVASTATIN CALCIUM 10 MG PO TABS
10.0000 mg | ORAL_TABLET | Freq: Every evening | ORAL | Status: DC
  Administered 2015-04-15 – 2015-04-17 (×3): 10 mg via ORAL
  Filled 2015-04-15 (×3): qty 1

## 2015-04-15 MED ORDER — ALBUTEROL SULFATE HFA 108 (90 BASE) MCG/ACT IN AERS: 2.0000 | INHALATION_SPRAY | Freq: Four times a day (QID) | RESPIRATORY_TRACT | Status: DC

## 2015-04-15 MED ORDER — SODIUM CHLORIDE 0.9 % IV SOLN: 250.0000 mL | INTRAVENOUS | Status: DC | PRN

## 2015-04-15 MED ORDER — METRONIDAZOLE IN NACL 5-0.79 MG/ML-% IV SOLN
500.0000 mg | Freq: Three times a day (TID) | INTRAVENOUS | Status: DC
  Administered 2015-04-15 – 2015-04-17 (×7): 500 mg via INTRAVENOUS
  Filled 2015-04-15 (×7): qty 100

## 2015-04-15 MED ORDER — METRONIDAZOLE IN NACL 5-0.79 MG/ML-% IV SOLN
500.0000 mg | Freq: Once | INTRAVENOUS | Status: AC
  Administered 2015-04-15: 500 mg via INTRAVENOUS
  Filled 2015-04-15: qty 100

## 2015-04-15 MED ORDER — GABAPENTIN 300 MG PO CAPS
600.0000 mg | ORAL_CAPSULE | Freq: Three times a day (TID) | ORAL | Status: DC
  Administered 2015-04-15 – 2015-04-18 (×9): 600 mg via ORAL
  Filled 2015-04-15 (×4): qty 2
  Filled 2015-04-15: qty 6
  Filled 2015-04-15 (×4): qty 2

## 2015-04-15 MED ORDER — SODIUM CHLORIDE 0.9% FLUSH: 3.0000 mL | INTRAVENOUS | Status: DC | PRN

## 2015-04-15 MED ORDER — AMLODIPINE BESYLATE 5 MG PO TABS
5.0000 mg | ORAL_TABLET | Freq: Every day | ORAL | Status: DC
  Administered 2015-04-15 – 2015-04-18 (×4): 5 mg via ORAL
  Filled 2015-04-15 (×4): qty 1

## 2015-04-15 MED ORDER — GABAPENTIN 600 MG PO TABS
600.0000 mg | ORAL_TABLET | Freq: Three times a day (TID) | ORAL | Status: DC
  Filled 2015-04-15 (×3): qty 1

## 2015-04-15 MED ORDER — FUROSEMIDE 20 MG PO TABS
20.0000 mg | ORAL_TABLET | Freq: Every day | ORAL | Status: DC
  Administered 2015-04-15 – 2015-04-18 (×4): 20 mg via ORAL
  Filled 2015-04-15 (×4): qty 1

## 2015-04-15 NOTE — Progress Notes (Signed)
Subjective: Patient is admitted due to rectal bleeding. His Ct scan of the abdomen is showing sign of colitis in addition to the changes related to his hepatocellular carcinoma.  Objective: Vital signs in last 24 hours: Temp:  [97.6 F (36.4 C)-98.9 F (37.2 C)] 98 F (36.7 C) (03/28 1340) Pulse Rate:  [56-91] 57 (03/28 1340) Resp:  [12-22] 14 (03/28 1340) BP: (96-145)/(68-87) 128/77 mmHg (03/28 1340) SpO2:  [97 %-100 %] 99 % (03/28 1340) Weight:  [83.462 kg (184 lb)] 83.462 kg (184 lb) (03/28 1340) Weight change:  Last BM Date: 04/14/15  Intake/Output from previous day:    PHYSICAL EXAM General appearance: alert and no distress Resp: diminished breath sounds bibasilar and rhonchi bilaterally Cardio: S1, S2 normal GI: soft and lax, power sound is positive , no tenderness Extremities: extremities normal, atraumatic, no cyanosis or edema  Lab Results:  Results for orders placed or performed during the hospital encounter of 04/14/15 (from the past 48 hour(s))  Comprehensive metabolic panel     Status: Abnormal   Collection Time: 04/14/15 10:20 PM  Result Value Ref Range   Sodium 135 135 - 145 mmol/L   Potassium 3.6 3.5 - 5.1 mmol/L   Chloride 104 101 - 111 mmol/L   CO2 23 22 - 32 mmol/L   Glucose, Bld 133 (H) 65 - 99 mg/dL   BUN 15 6 - 20 mg/dL   Creatinine, Ser 0.96 0.61 - 1.24 mg/dL   Calcium 8.3 (L) 8.9 - 10.3 mg/dL   Total Protein 7.5 6.5 - 8.1 g/dL   Albumin 2.6 (L) 3.5 - 5.0 g/dL   AST 73 (H) 15 - 41 U/L   ALT 37 17 - 63 U/L   Alkaline Phosphatase 128 (H) 38 - 126 U/L   Total Bilirubin 0.6 0.3 - 1.2 mg/dL   GFR calc non Af Amer >60 >60 mL/min   GFR calc Af Amer >60 >60 mL/min    Comment: (NOTE) The eGFR has been calculated using the CKD EPI equation. This calculation has not been validated in all clinical situations. eGFR's persistently <60 mL/min signify possible Chronic Kidney Disease.    Anion gap 8 5 - 15  CBC     Status: Abnormal   Collection Time:  04/14/15 10:20 PM  Result Value Ref Range   WBC 4.6 4.0 - 10.5 K/uL   RBC 3.49 (L) 4.22 - 5.81 MIL/uL   Hemoglobin 9.9 (L) 13.0 - 17.0 g/dL   HCT 29.5 (L) 39.0 - 52.0 %   MCV 84.5 78.0 - 100.0 fL   MCH 28.4 26.0 - 34.0 pg   MCHC 33.6 30.0 - 36.0 g/dL   RDW 15.3 11.5 - 15.5 %   Platelets 123 (L) 150 - 400 K/uL  Type and screen [facility]     Status: None   Collection Time: 04/14/15 10:20 PM  Result Value Ref Range   ABO/RH(D) A POS    Antibody Screen NEG    Sample Expiration 04/17/2015   Protime-INR - (order if Patient is taking Coumadin / Warfarin)     Status: Abnormal   Collection Time: 04/14/15 10:20 PM  Result Value Ref Range   Prothrombin Time 26.6 (H) 11.6 - 15.2 seconds   INR 2.49 (H) 0.00 - 1.49  POC occult blood, ED     Status: Abnormal   Collection Time: 04/14/15 11:16 PM  Result Value Ref Range   Fecal Occult Bld POSITIVE (A) NEGATIVE  Lactic acid, plasma     Status:  None   Collection Time: 04/15/15  1:14 AM  Result Value Ref Range   Lactic Acid, Venous 1.3 0.5 - 2.0 mmol/L  Basic metabolic panel     Status: Abnormal   Collection Time: 04/15/15  6:12 AM  Result Value Ref Range   Sodium 139 135 - 145 mmol/L   Potassium 3.4 (L) 3.5 - 5.1 mmol/L   Chloride 108 101 - 111 mmol/L   CO2 25 22 - 32 mmol/L   Glucose, Bld 100 (H) 65 - 99 mg/dL   BUN 12 6 - 20 mg/dL   Creatinine, Ser 0.78 0.61 - 1.24 mg/dL   Calcium 7.7 (L) 8.9 - 10.3 mg/dL   GFR calc non Af Amer >60 >60 mL/min   GFR calc Af Amer >60 >60 mL/min    Comment: (NOTE) The eGFR has been calculated using the CKD EPI equation. This calculation has not been validated in all clinical situations. eGFR's persistently <60 mL/min signify possible Chronic Kidney Disease.    Anion gap 6 5 - 15    ABGS No results for input(s): PHART, PO2ART, TCO2, HCO3 in the last 72 hours.  Invalid input(s): PCO2 CULTURES No results found for this or any previous visit (from the past 240  hour(s)). Studies/Results: Ct Abdomen Pelvis W Contrast  04/15/2015  CLINICAL DATA:  Acute onset of generalized abdominal pain. Dark stools. Patient on chemotherapy for liver cancer. Initial encounter. EXAM: CT ABDOMEN AND PELVIS WITH CONTRAST TECHNIQUE: Multidetector CT imaging of the abdomen and pelvis was performed using the standard protocol following bolus administration of intravenous contrast. CONTRAST:  55m OMNIPAQUE IOHEXOL 300 MG/ML  SOLN COMPARISON:  CT the abdomen and pelvis performed 03/20/2014, and MRI of the abdomen performed 03/26/2014 FINDINGS: Worsening bilateral central airspace opacities and nodularity may reflect chronic infection. Metastatic disease cannot be entirely excluded, but is considered less likely given the appearance. A heterogeneous mass is noted occupying part of the central right hepatic lobe, measuring up to 8 cm in size. The underlying nodular contour of the liver raises concern for hepatic cirrhosis. The spleen is unremarkable in appearance. The patient is status post cholecystectomy, with clips noted at the gallbladder fossa. The pancreas and adrenal glands are unremarkable. The kidneys are unremarkable in appearance. There is no evidence of hydronephrosis. No renal or ureteral stones are seen. No perinephric stranding is appreciated. No free fluid is identified. The small bowel is unremarkable in appearance. The stomach is within normal limits. No acute vascular abnormalities are seen. Scattered calcification is noted along the abdominal aorta and its branches. The appendix is normal in caliber and contains air, without evidence of appendicitis. Mild wall thickening is noted along the ascending colon, with mild surrounding soft tissue inflammation, concerning for mild infectious or inflammatory colitis. The remainder of the colon is unremarkable in appearance. The bladder is mildly distended and grossly unremarkable. The prostate remains normal in size. 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 Postoperative change is noted at the left inguinal region. No inguinal lymphadenopathy is seen. No acute osseous abnormalities are identified. Multilevel vacuum phenomenon is noted along the lumbar spine. IMPRESSION: 1. Mild wall thickening along the ascending colon, with mild surrounding soft inflammation, concerning for infectious or inflammatory colitis. 2. 8 cm heterogeneous mass again noted occupying the central right hepatic lobe, reflecting the patient's known malignancy. This is significantly increased from prior studies. Underlying changes of hepatic cirrhosis. 3. Worsening bilateral central airspace opacities and nodularity may  reflect chronic worsening infection. The appearance is less concerning for metastatic disease. 4. Scattered calcification along the abdominal aorta and its branches. 5. Superior displacement of the right testis, noted along the right inguinal canal. 6. Degenerative change along the lumbar spine. Electronically Signed   By: Garald Balding M.D.   On: 04/15/2015 00:22    Medications: I have reviewed the patient's current medications.  Assesment:   Principal Problem:   Colitis, acute Active Problems:   ETOH abuse   Essential hypertension   Cancer, hepatocellular (HCC)   Rectal bleeding    Plan:  Medications reviewed Will continue antibiotics GI consult Will start on clear liquid diet      LOS: 0 days   Ava Deguire 04/15/2015, 8:20 PM

## 2015-04-15 NOTE — ED Notes (Addendum)
Assumed care of patient from [place], [place]. Pt resting quietly. No distress. VSS. Denies pain. Pt awaiting bed here at  [facility] - updated on delay. Call bell within reach. Pt neuro intact, requesting meal, pt is NPO and has been waiting 12 hours for bed - contacted accepting provider and requested meal for patient. Pt unable to provide stool specimen at this time.

## 2015-04-15 NOTE — Progress Notes (Signed)
Patient wants diet order. Dr. Legrand Rams notified. New order for GI consult and clear liquid diet.

## 2015-04-15 NOTE — ED Notes (Signed)
Pt ambulatory to bathroom.  No complaints at this time.  Denies any pain.  Asking for something to eat.  Advised pt that he is NPO.

## 2015-04-15 NOTE — H&P (Signed)
PCP:   FANTA,TESFAYE, MD   Chief Complaint:  Rectal bleeding  HPI: 60 yo male h/o [place] of the liver, h/o etoh abuse sober for over a year, on chemo therapy comes in with several episodes of bloody stool.  Pt denies running any fevers.  No abdominal pain.  No n/v.  No sick contacts with GI illness.  Pt found to have colitis on ct scan.  Referred for admission for rectal bleeding in the setting of infection in an immunocompromised patient.  Pt denies any recent abx or GI bleeding.  Review of Systems:  Positive and negative as per HPI otherwise all other systems are negative  Past Medical History: Past Medical History  Diagnosis Date  . Asthma   . Cancer ([place])   . Hypertension   . Hyperlipidemia   . 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 Tonsil cancer ([place])   . Liver cancer [facility])    Past Surgical History  Procedure Laterality Date  . Cholecystectomy    . Surgery to remove cancer from neck      6 yrs ago  . Hepatic artery embolization  07/18/14    [facility]    Medications: Prior to Admission medications   Medication Sig Start Date End Date Taking? Authorizing Provider  albuterol (PROVENTIL HFA;VENTOLIN HFA) 108 (90 BASE) MCG/ACT inhaler Inhale 2 puffs into the lungs 4 (four) times daily.    Yes Historical Provider, MD  gabapentin (NEURONTIN) 600 MG tablet Take 600 mg by mouth 3 (three) times daily.     Yes Historical Provider, MD  hydroxypropyl methylcellulose / hypromellose (ISOPTO TEARS / GONIOVISC) 2.5 % ophthalmic solution Place 1 drop into both eyes daily as needed for dry eyes.   Yes Historical Provider, MD  naproxen sodium (ALEVE) 220 MG tablet Take 220-440 mg by mouth daily as needed (for pain).   Yes Historical Provider, MD  omeprazole (PRILOSEC) 40 MG capsule Take 40 mg by mouth daily.     Yes Historical Provider, MD  rivaroxaban (XARELTO) 20 MG TABS tablet Take 20 mg by mouth every evening. 04/10/15 04/09/16 Yes Historical Provider, MD  SORAfenib (NEXAVAR) 200 MG tablet Take 400 mg by  mouth 2 (two) times daily. 04/02/15 04/01/16 Yes Historical Provider, MD  spironolactone (ALDACTONE) 25 MG tablet Take 25 mg by mouth 2 (two) times daily.   Yes Historical Provider, MD  amLODipine (NORVASC) 5 MG tablet Take 5 mg by mouth daily. 03/02/14   Historical Provider, MD  atorvastatin (LIPITOR) 10 MG tablet Take 10 mg by mouth every evening. 04/05/15   Historical Provider, MD  furosemide (LASIX) 20 MG tablet Take 20 mg by mouth daily. 07/27/14   Historical Provider, MD  HYDROcodone-acetaminophen (NORCO/VICODIN) 5-325 MG tablet Take 2 tablets by mouth every 4 (four) hours as needed for moderate pain. Patient not taking: Reported on 04/14/2015 02/22/15   Orpah Greek, MD  ibuprofen (ADVIL,MOTRIN) 200 MG tablet Take 200 mg by mouth every 6 (six) hours as needed.    Historical Provider, MD  meloxicam (MOBIC) 15 MG tablet Take 15 mg by mouth daily. 08/10/14   Historical Provider, MD  predniSONE (DELTASONE) 20 MG tablet Take 2 tablets (40 mg total) by mouth daily with breakfast. Patient not taking: Reported on 04/14/2015 02/22/15   Orpah Greek, MD    Allergies:  No Known Allergies  Social History:  reports that he quit smoking about 8 years ago. His smoking use included Cigarettes. He has a 3.5 pack-year smoking history. He has  never used smokeless tobacco. He reports that he drinks alcohol. He reports that he does not use illicit drugs.  Family History: Family History  Problem Relation Age of Onset  . Family history unknown: Yes    Physical Exam: Filed Vitals:   04/15/15 0030 04/15/15 0130 04/15/15 0130 04/15/15 0200  BP: 115/76 127/85 131/84 122/79  Pulse: 70 69 70 70  Temp:   97.7 F (36.5 C)   TempSrc:   Oral   Resp: 13 14 18 16   Height:      Weight:      SpO2: 97% 97% 97% 97%   General appearance: alert, cooperative, icteric and no distress Head: Normocephalic, without obvious abnormality, atraumatic Eyes: negative Nose: Nares normal. Septum midline. Mucosa  normal. No drainage or sinus tenderness. Neck: no JVD and supple, symmetrical, trachea midline Lungs: clear to auscultation bilaterally Heart: regular rate and rhythm, S1, S2 normal, no murmur, click, rub or gallop Abdomen: soft, non-tender; bowel sounds normal; no masses,  no organomegaly Extremities: extremities normal, atraumatic, no cyanosis or edema Pulses: 2+ and symmetric Skin: Skin color, texture, turgor normal. No rashes or lesions Neurologic: Grossly normal    Labs on Admission:   Recent Labs  04/14/15 2220  NA 135  K 3.6  CL 104  CO2 23  GLUCOSE 133*  BUN 15  CREATININE 0.96  CALCIUM 8.3*    Recent Labs  04/14/15 2220  AST 73*  ALT 37  ALKPHOS 128*  BILITOT 0.6  PROT 7.5  ALBUMIN 2.6*    Recent Labs  04/14/15 2220  WBC 4.6  HGB 9.9*  HCT 29.5*  MCV 84.5  PLT 123*    Radiological Exams on Admission: Ct Abdomen Pelvis W Contrast  04/15/2015  CLINICAL DATA:  Acute onset of generalized abdominal pain. Dark stools. Patient on chemotherapy for liver cancer. Initial encounter. EXAM: CT ABDOMEN AND PELVIS WITH CONTRAST TECHNIQUE: Multidetector CT imaging of the abdomen and pelvis was performed using the standard protocol following bolus administration of intravenous contrast. CONTRAST:  47mL OMNIPAQUE IOHEXOL 300 MG/ML  SOLN COMPARISON:  CT the abdomen and pelvis performed 03/20/2014, and MRI of the abdomen performed 03/26/2014 FINDINGS: Worsening bilateral central airspace opacities and nodularity may reflect chronic infection. Metastatic disease cannot be entirely excluded, but is considered less likely given the appearance. A heterogeneous mass is noted occupying part of the central right hepatic lobe, measuring up to 8 cm in size. The underlying nodular contour of the liver raises concern for hepatic cirrhosis. The spleen is unremarkable in appearance. The patient is status post cholecystectomy, with clips noted at the gallbladder fossa. The pancreas and  adrenal glands are unremarkable. The kidneys are unremarkable in appearance. There is no evidence of hydronephrosis. No renal or ureteral stones are seen. No perinephric stranding is appreciated. No free fluid is identified. The small bowel is unremarkable in appearance. The stomach is within normal limits. No acute vascular abnormalities are seen. Scattered calcification is noted along the abdominal aorta and its branches. The appendix is normal in caliber and contains air, without evidence of appendicitis. Mild wall thickening is noted along the ascending colon, with mild surrounding soft tissue inflammation, concerning for mild infectious or inflammatory colitis. The remainder of the colon is unremarkable in appearance. The bladder is mildly distended and grossly unremarkable. The prostate remains normal in size. There is superior displacement of the right testis, noted along the right inguinal canal. Postoperative change is noted at the left inguinal region. No inguinal lymphadenopathy is  seen. No acute osseous abnormalities are identified. Multilevel vacuum phenomenon is noted along the lumbar spine. IMPRESSION: 1. Mild wall thickening along the ascending colon, with mild surrounding soft inflammation, concerning for infectious or inflammatory colitis. 2. 8 cm heterogeneous mass again noted occupying the central right hepatic lobe, reflecting the patient's known malignancy. This is significantly increased from prior studies. Underlying changes of hepatic cirrhosis. 3. Worsening bilateral central airspace opacities and nodularity may reflect chronic worsening infection. The appearance is less concerning for metastatic disease. 4. Scattered calcification along the abdominal aorta and its branches. 5. Superior displacement of the right testis, noted along the right inguinal canal. 6. Degenerative change along the lumbar spine. Electronically Signed   By: Garald Balding M.D.   On: 04/15/2015 00:22     Assessment/Plan  60 yo male with hemetechezia with acute colitis  Principal Problem:   Colitis, acute- with bleeding.  Place on iv cipro and flagyl.  Check cdiff and stool studies along with stool culture.  hgb at baseline, repeat cbc in am.  No further bloody bm since arrival to the ED and per rectal with dr Dina Rich pt with gross maroonish blood on exam.  abd exam is benign.  Lactate normal.  afvss.  Active Problems:   ETOH abuse sober for over a year - noted   Essential hypertension- hold bp meds in the setting of acute bleed issues   Cancer, hepatocellular ([place])- noted   Rectal bleeding- as above  obs on tele.  If no further bleeding and no drop in hgb consider d/c tomorrow with antibiotics.  Jelissa Espiritu A 04/15/2015, 3:37 AM

## 2015-04-16 DIAGNOSIS — I10 Essential (primary) hypertension: Secondary | ICD-10-CM

## 2015-04-16 DIAGNOSIS — K922 Gastrointestinal hemorrhage, unspecified: Secondary | ICD-10-CM

## 2015-04-16 DIAGNOSIS — K529 Noninfective gastroenteritis and colitis, unspecified: Principal | ICD-10-CM

## 2015-04-16 LAB — C DIFFICILE QUICK SCREEN W PCR REFLEX
C DIFFICILE (CDIFF) TOXIN: NEGATIVE
C Diff antigen: NEGATIVE
C Diff interpretation: NEGATIVE

## 2015-04-16 MED ORDER — POTASSIUM CHLORIDE CRYS ER 20 MEQ PO TBCR
40.0000 meq | EXTENDED_RELEASE_TABLET | Freq: Once | ORAL | Status: AC
  Administered 2015-04-16: 40 meq via ORAL
  Filled 2015-04-16: qty 4

## 2015-04-16 NOTE — Consult Note (Signed)
Reason for Consult: GI bleed Referring Physician:   DAJUAN TURNLEY is an 60 y.o. male.  HPI: Admitted yesterday thru the ED with c/o rectal bleeding.  He tells me he had rectal bleeding. He describes as a Forensic psychologist.  He describes as dark in color. He is having diarrhea. The diarrhea started 2 days. He was having multiple loose stools. He denies fever. He did have a headache.  There has been no rectal bleeding since admission. Hx of metastatic  liver cancer and is followed by [facility]. States he was seen at [facility] last week.    Presently taking Nexavar from [facility]. Will try to get records from [facility].  He received palliative radiation in [place] at [facility].  No recent antibiotics. Fecal occult blood positive in the ED. Stool maroon in colored in ED.  Appetite is okay.  He has tenderness in his lower abdomen. His last colonoscopy was in 2009.  Has not drank since September 2016. Hx of Hep C positive antibody with undetected viral load.   02/20/2007 Colonoscopy Dr. Laural Golden: average risk screening: A 35m polyp ascending colon,. Small external hemorrhoids.' I was unable to locate biopsy.   CBC Latest Ref Rng 04/14/2015 03/02/2014 05/31/2012  WBC 4.0 - 10.5 K/uL 4.6 3.9(L) 3.4(L)  Hemoglobin 13.0 - 17.0 g/dL 9.9(L) 10.5(L) 11.2(L)  Hematocrit 39.0 - 52.0 % 29.5(L) 31.5(L) 30.8(L)  Platelets 150 - 400 K/uL 123(L) 83(L) 101(L)    I/O last 3 completed shifts: In: 240 [P.O.:240] Out: -        Past Medical History  Diagnosis Date  . Asthma   . Cancer ([facility]   . Hypertension   . Hyperlipidemia   . 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 [place] [facility] [place]   . [facility] ([facility]     Past Surgical History  Procedure Laterality Date  . Cholecystectomy    . Surgery to remove cancer from neck      6 yrs ago  . Hepatic artery embolization  07/18/14    [facility]    Family History  Problem Relation Age of Onset  . Family history unknown: Yes    Social History:  reports that  he quit smoking about 8 years ago. His smoking use included Cigarettes. He has a 3.5 pack-year smoking history. He has never used smokeless tobacco. He reports that he drinks alcohol. He reports that he does not use illicit drugs.  Allergies: No Known Allergies  Medications: I have reviewed the patient's current medications.  Results for orders placed or performed during the hospital encounter of 04/14/15 (from the past 48 hour(s))  Comprehensive metabolic panel     Status: Abnormal   Collection Time: 04/14/15 10:20 PM  Result Value Ref Range   Sodium 135 135 - 145 mmol/L   Potassium 3.6 3.5 - 5.1 mmol/L   Chloride 104 101 - 111 mmol/L   CO2 23 22 - 32 mmol/L   Glucose, Bld 133 (H) 65 - 99 mg/dL   BUN 15 6 - 20 mg/dL   Creatinine, Ser 0.96 0.61 - 1.24 mg/dL   Calcium 8.3 (L) 8.9 - 10.3 mg/dL   Total Protein 7.5 6.5 - 8.1 g/dL   Albumin 2.6 (L) 3.5 - 5.0 g/dL   AST 73 (H) 15 - 41 U/L   ALT 37 17 - 63 U/L   Alkaline Phosphatase 128 (H) 38 - 126 U/L   Total Bilirubin 0.6 0.3 - 1.2 mg/dL   GFR calc  non Af Amer >60 >60 mL/min   GFR calc Af Amer >60 >60 mL/min    Comment: (NOTE) The eGFR has been calculated using the CKD EPI equation. This calculation has not been validated in all clinical situations. eGFR's persistently <60 mL/min signify possible Chronic Kidney Disease.    Anion gap 8 5 - 15  CBC     Status: Abnormal   Collection Time: 04/14/15 10:20 PM  Result Value Ref Range   WBC 4.6 4.0 - 10.5 K/uL   RBC 3.49 (L) 4.22 - 5.81 MIL/uL   Hemoglobin 9.9 (L) 13.0 - 17.0 g/dL   HCT 29.5 (L) 39.0 - 52.0 %   MCV 84.5 78.0 - 100.0 fL   MCH 28.4 26.0 - 34.0 pg   MCHC 33.6 30.0 - 36.0 g/dL   RDW 15.3 11.5 - 15.5 %   Platelets 123 (L) 150 - 400 K/uL  Type and screen [facility]     Status: None   Collection Time: 04/14/15 10:20 PM  Result Value Ref Range   ABO/RH(D) A POS    Antibody Screen NEG    Sample Expiration 04/17/2015   Protime-INR - (order if Patient is taking  Coumadin / Warfarin)     Status: Abnormal   Collection Time: 04/14/15 10:20 PM  Result Value Ref Range   Prothrombin Time 26.6 (H) 11.6 - 15.2 seconds   INR 2.49 (H) 0.00 - 1.49  POC occult blood, ED     Status: Abnormal   Collection Time: 04/14/15 11:16 PM  Result Value Ref Range   Fecal Occult Bld POSITIVE (A) NEGATIVE  Lactic acid, plasma     Status: None   Collection Time: 04/15/15  1:14 AM  Result Value Ref Range   Lactic Acid, Venous 1.3 0.5 - 2.0 mmol/L  Basic metabolic panel     Status: Abnormal   Collection Time: 04/15/15  6:12 AM  Result Value Ref Range   Sodium 139 135 - 145 mmol/L   Potassium 3.4 (L) 3.5 - 5.1 mmol/L   Chloride 108 101 - 111 mmol/L   CO2 25 22 - 32 mmol/L   Glucose, Bld 100 (H) 65 - 99 mg/dL   BUN 12 6 - 20 mg/dL   Creatinine, Ser 0.78 0.61 - 1.24 mg/dL   Calcium 7.7 (L) 8.9 - 10.3 mg/dL   GFR calc non Af Amer >60 >60 mL/min   GFR calc Af Amer >60 >60 mL/min    Comment: (NOTE) The eGFR has been calculated using the CKD EPI equation. This calculation has not been validated in all clinical situations. eGFR's persistently <60 mL/min signify possible Chronic Kidney Disease.    Anion gap 6 5 - 15    Ct Abdomen Pelvis W Contrast  04/15/2015  CLINICAL DATA:  Acute onset of generalized abdominal pain. Dark stools. Patient on chemotherapy for liver cancer. Initial encounter. EXAM: CT ABDOMEN AND PELVIS WITH CONTRAST TECHNIQUE: Multidetector CT imaging of the abdomen and pelvis was performed using the standard protocol following bolus administration of intravenous contrast. CONTRAST:  79m OMNIPAQUE IOHEXOL 300 MG/ML  SOLN COMPARISON:  CT the abdomen and pelvis performed 03/20/2014, and MRI of the abdomen performed 03/26/2014 FINDINGS: Worsening bilateral central airspace opacities and nodularity may reflect chronic infection. Metastatic disease cannot be entirely excluded, but is considered less likely given the appearance. A heterogeneous mass is noted  occupying part of the central right hepatic lobe, measuring up to 8 cm in size. The underlying nodular contour of the liver  raises concern for hepatic cirrhosis. The spleen is unremarkable in appearance. The patient is status post cholecystectomy, with clips noted at the gallbladder fossa. The pancreas and adrenal glands are unremarkable. The kidneys are unremarkable in appearance. There is no evidence of hydronephrosis. No renal or ureteral stones are seen. No perinephric stranding is appreciated. No free fluid is identified. The small bowel is unremarkable in appearance. The stomach is within normal limits. No acute vascular abnormalities are seen. Scattered calcification is noted along the abdominal aorta and its branches. The appendix is normal in caliber and contains air, without evidence of appendicitis. Mild wall thickening is noted along the ascending colon, with mild surrounding soft tissue inflammation, concerning for mild infectious or inflammatory colitis. The remainder of the colon is unremarkable in appearance. The bladder is mildly distended and grossly unremarkable. The prostate remains normal in size. There is superior displacement of the right testis, noted along the right inguinal canal. Postoperative change is noted at the left inguinal region. No inguinal lymphadenopathy is seen. No acute osseous abnormalities are identified. Multilevel vacuum phenomenon is noted along the lumbar spine. IMPRESSION: 1. Mild wall thickening along the ascending colon, with mild surrounding soft inflammation, concerning for infectious or inflammatory colitis. 2. 8 cm heterogeneous mass again noted occupying the central right hepatic lobe, reflecting the patient's known malignancy. This is significantly increased from prior studies. Underlying changes of hepatic cirrhosis. 3. Worsening bilateral central airspace opacities and nodularity may reflect chronic worsening infection. The appearance is less concerning for  metastatic disease. 4. Scattered calcification along the abdominal aorta and its branches. 5. Superior displacement of the right testis, noted along the right inguinal canal. 6. Degenerative change along the lumbar spine. Electronically Signed   By: Garald Balding M.D.   On: 04/15/2015 00:22    ROS Blood pressure 107/69, pulse 65, temperature 98.4 F (36.9 C), temperature source Oral, resp. rate 16, height '5\' 9"'  (1.753 m), weight 183 lb 8 oz (83.235 kg), SpO2 98 %. Physical Exam Alert and oriented. Skin warm and dry. Oral mucosa is moist.   . Sclera anicteric, conjunctivae is pink. Thyroid not enlarged. No cervical lymphadenopathy. Lungs clear. Heart regular rate and rhythm.  Abdomen is soft. Bowel sounds are positive. No hepatomegaly. No abdominal masses felt. No tenderness.  No edema to lower extremities.   Assessment/Plan: Rectal bleeding ? Infectious or inflammatory. Agree with Cipro and Flagyl. Stool studies are pending.  Will get records from [facility].   SETZER,TERRI W 04/16/2015, 7:54 AM     GI attending note: Patient interviewed and examined. Lab studies and CT reviewed. Patient is 60 year old unfortunate male with metastatic [place] who presents with acute onset of nonbloody diarrhea and CT revealed thickening to proximal colon. Patient has been on Sorafenib since September 2016. Suspect acute illness secondary to infection. Doubt acute colitis is secondary to Sorafenib. Will advance diet to full liquids. Since stool for GI pathogen panel.

## 2015-04-16 NOTE — Progress Notes (Signed)
Subjective: Patient is resting. No rectal bleeding since admission. No nausea, vomiting or abdominal pain. He is on IV antibiotics. Objective: Vital signs in last 24 hours: Temp:  [98 F (36.7 C)-98.6 F (37 C)] 98.4 F (36.9 C) (03/29 0500) Pulse Rate:  [56-83] 65 (03/29 0500) Resp:  [12-22] 16 (03/29 0500) BP: (98-128)/(64-83) 107/69 mmHg (03/29 0500) SpO2:  [97 %-100 %] 98 % (03/29 0500) Weight:  [83.235 kg (183 lb 8 oz)-83.462 kg (184 lb)] 83.235 kg (183 lb 8 oz) (03/29 0500) Weight change: 0 kg (0 lb) Last BM Date: 04/16/15 (pt reminded we need stool sample)  Intake/Output from previous day: 03/28 0701 - 03/29 0700 In: 240 [P.O.:240] Out: -   PHYSICAL EXAM General appearance: alert and no distress Resp: diminished breath sounds bibasilar and rhonchi bilaterally Cardio: S1, S2 normal GI: soft and lax, power sound is positive , no tenderness Extremities: extremities normal, atraumatic, no cyanosis or edema  Lab Results:  Results for orders placed or performed during the hospital encounter of 04/14/15 (from the past 48 hour(s))  Comprehensive metabolic panel     Status: Abnormal   Collection Time: 04/14/15 10:20 PM  Result Value Ref Range   Sodium 135 135 - 145 mmol/L   Potassium 3.6 3.5 - 5.1 mmol/L   Chloride 104 101 - 111 mmol/L   CO2 23 22 - 32 mmol/L   Glucose, Bld 133 (H) 65 - 99 mg/dL   BUN 15 6 - 20 mg/dL   Creatinine, Ser 0.96 0.61 - 1.24 mg/dL   Calcium 8.3 (L) 8.9 - 10.3 mg/dL   Total Protein 7.5 6.5 - 8.1 g/dL   Albumin 2.6 (L) 3.5 - 5.0 g/dL   AST 73 (H) 15 - 41 U/L   ALT 37 17 - 63 U/L   Alkaline Phosphatase 128 (H) 38 - 126 U/L   Total Bilirubin 0.6 0.3 - 1.2 mg/dL   GFR calc non Af Amer >60 >60 mL/min   GFR calc Af Amer >60 >60 mL/min    Comment: (NOTE) The eGFR has been calculated using the CKD EPI equation. This calculation has not been validated in all clinical situations. eGFR's persistently <60 mL/min signify possible Chronic  Kidney Disease.    Anion gap 8 5 - 15  CBC     Status: Abnormal   Collection Time: 04/14/15 10:20 PM  Result Value Ref Range   WBC 4.6 4.0 - 10.5 K/uL   RBC 3.49 (L) 4.22 - 5.81 MIL/uL   Hemoglobin 9.9 (L) 13.0 - 17.0 g/dL   HCT 29.5 (L) 39.0 - 52.0 %   MCV 84.5 78.0 - 100.0 fL   MCH 28.4 26.0 - 34.0 pg   MCHC 33.6 30.0 - 36.0 g/dL   RDW 15.3 11.5 - 15.5 %   Platelets 123 (L) 150 - 400 K/uL  Type and screen [facility]     Status: None   Collection Time: 04/14/15 10:20 PM  Result Value Ref Range   ABO/RH(D) A POS    Antibody Screen NEG    Sample Expiration 04/17/2015   Protime-INR - (order if Patient is taking Coumadin / Warfarin)     Status: Abnormal   Collection Time: 04/14/15 10:20 PM  Result Value Ref Range   Prothrombin Time 26.6 (H) 11.6 - 15.2 seconds   INR 2.49 (H) 0.00 - 1.49  POC occult blood, ED     Status: Abnormal   Collection Time: 04/14/15 11:16 PM  Result Value Ref Range  Fecal Occult Bld POSITIVE (A) NEGATIVE  Lactic acid, plasma     Status: None   Collection Time: 04/15/15  1:14 AM  Result Value Ref Range   Lactic Acid, Venous 1.3 0.5 - 2.0 mmol/L  Basic metabolic panel     Status: Abnormal   Collection Time: 04/15/15  6:12 AM  Result Value Ref Range   Sodium 139 135 - 145 mmol/L   Potassium 3.4 (L) 3.5 - 5.1 mmol/L   Chloride 108 101 - 111 mmol/L   CO2 25 22 - 32 mmol/L   Glucose, Bld 100 (H) 65 - 99 mg/dL   BUN 12 6 - 20 mg/dL   Creatinine, Ser 0.78 0.61 - 1.24 mg/dL   Calcium 7.7 (L) 8.9 - 10.3 mg/dL   GFR calc non Af Amer >60 >60 mL/min   GFR calc Af Amer >60 >60 mL/min    Comment: (NOTE) The eGFR has been calculated using the CKD EPI equation. This calculation has not been validated in all clinical situations. eGFR's persistently <60 mL/min signify possible Chronic Kidney Disease.    Anion gap 6 5 - 15    ABGS No results for input(s): PHART, PO2ART, TCO2, HCO3 in the last 72 hours.  Invalid input(s): PCO2 CULTURES No  results found for this or any previous visit (from the past 240 hour(s)). Studies/Results: Ct Abdomen Pelvis W Contrast  04/15/2015  CLINICAL DATA:  Acute onset of generalized abdominal pain. Dark stools. Patient on chemotherapy for liver cancer. Initial encounter. EXAM: CT ABDOMEN AND PELVIS WITH CONTRAST TECHNIQUE: Multidetector CT imaging of the abdomen and pelvis was performed using the standard protocol following bolus administration of intravenous contrast. CONTRAST:  57m OMNIPAQUE IOHEXOL 300 MG/ML  SOLN COMPARISON:  CT the abdomen and pelvis performed 03/20/2014, and MRI of the abdomen performed 03/26/2014 FINDINGS: Worsening bilateral central airspace opacities and nodularity may reflect chronic infection. Metastatic disease cannot be entirely excluded, but is considered less likely given the appearance. A heterogeneous mass is noted occupying part of the central right hepatic lobe, measuring up to 8 cm in size. The underlying nodular contour of the liver raises concern for hepatic cirrhosis. The spleen is unremarkable in appearance. The patient is status post cholecystectomy, with clips noted at the gallbladder fossa. The pancreas and adrenal glands are unremarkable. The kidneys are unremarkable in appearance. There is no evidence of hydronephrosis. No renal or ureteral stones are seen. No perinephric stranding is appreciated. No free fluid is identified. The small bowel is unremarkable in appearance. The stomach is within normal limits. No acute vascular abnormalities are seen. Scattered calcification is noted along the abdominal aorta and its branches. The appendix is normal in caliber and contains air, without evidence of appendicitis. Mild wall thickening is noted along the ascending colon, with mild surrounding soft tissue inflammation, concerning for mild infectious or inflammatory colitis. The remainder of the colon is unremarkable in appearance. The bladder is mildly distended and grossly  unremarkable. The prostate remains normal in size. There is superior displacement of the right testis, noted along the right inguinal canal. Postoperative change is noted at the left inguinal region. No inguinal lymphadenopathy is seen. No acute osseous abnormalities are identified. Multilevel vacuum phenomenon is noted along the lumbar spine. IMPRESSION: 1. Mild wall thickening along the ascending colon, with mild surrounding soft inflammation, concerning for infectious or inflammatory colitis. 2. 8 cm heterogeneous mass again noted occupying the central right hepatic lobe, reflecting the patient's known malignancy. This is significantly increased from prior  studies. Underlying changes of hepatic cirrhosis. 3. Worsening bilateral central airspace opacities and nodularity may reflect chronic worsening infection. The appearance is less concerning for metastatic disease. 4. Scattered calcification along the abdominal aorta and its branches. 5. Superior displacement of the right testis, noted along the right inguinal canal. 6. Degenerative change along the lumbar spine. Electronically Signed   By: Garald Balding M.D.   On: 04/15/2015 00:22    Medications: I have reviewed the patient's current medications.  Assesment:   Principal Problem:   Colitis, acute Active Problems:   ETOH abuse   Essential hypertension   Cancer, hepatocellular (HCC)   Rectal bleeding    Plan:  Medications reviewed Will continue antibiotics GI consult pending continueclear liquid diet      LOS: 1 day   Tinnie Kunin 04/16/2015, 7:55 AM

## 2015-04-17 LAB — BASIC METABOLIC PANEL
Anion gap: 7 (ref 5–15)
BUN: 8 mg/dL (ref 6–20)
CHLORIDE: 104 mmol/L (ref 101–111)
CO2: 24 mmol/L (ref 22–32)
Calcium: 8 mg/dL — ABNORMAL LOW (ref 8.9–10.3)
Creatinine, Ser: 0.85 mg/dL (ref 0.61–1.24)
GFR calc non Af Amer: >60 mL/min (ref >60)
Glucose, Bld: 125 mg/dL — ABNORMAL HIGH (ref 65–99)
POTASSIUM: 3.7 mmol/L (ref 3.5–5.1)
SODIUM: 135 mmol/L (ref 135–145)

## 2015-04-17 LAB — FECAL LACTOFERRIN, QUANT: FECAL LACTOFERRIN: POSITIVE

## 2015-04-17 LAB — CBC
HCT: 31.6 % — ABNORMAL LOW (ref 39.0–52.0)
HEMOGLOBIN: 10.2 g/dL — AB (ref 13.0–17.0)
MCH: 27.6 pg (ref 26.0–34.0)
MCHC: 32.3 g/dL (ref 30.0–36.0)
MCV: 85.4 fL (ref 78.0–100.0)
Platelets: 130 10*3/uL — ABNORMAL LOW (ref 150–400)
RBC: 3.7 MIL/uL — AB (ref 4.22–5.81)
RDW: 15 % (ref 11.5–15.5)
WBC: 3.4 10*3/uL — ABNORMAL LOW (ref 4.0–10.5)

## 2015-04-17 MED ORDER — METRONIDAZOLE 500 MG PO TABS
250.0000 mg | ORAL_TABLET | Freq: Three times a day (TID) | ORAL | Status: DC
  Administered 2015-04-17 – 2015-04-18 (×3): 250 mg via ORAL
  Filled 2015-04-17 (×3): qty 1

## 2015-04-17 MED ORDER — CIPROFLOXACIN HCL 250 MG PO TABS
500.0000 mg | ORAL_TABLET | Freq: Two times a day (BID) | ORAL | Status: DC
  Administered 2015-04-17 – 2015-04-18 (×2): 500 mg via ORAL
  Filled 2015-04-17 (×2): qty 2

## 2015-04-17 NOTE — Progress Notes (Signed)
Subjective: Patient is resting. No new complanit. He is being evaluated by GI. Objective: Vital signs in last 24 hours: Temp:  [98.1 F (36.7 C)-98.3 F (36.8 C)] 98.3 F (36.8 C) (03/30 0500) Pulse Rate:  [64-79] 79 (03/30 0500) Resp:  [16-18] 18 (03/30 0500) BP: (97-108)/(58-65) 97/61 mmHg (03/30 0500) SpO2:  [98 %-100 %] 99 % (03/30 0500) Weight:  [83.19 kg (183 lb 6.4 oz)] 83.19 kg (183 lb 6.4 oz) (03/30 0500) Weight change: -0.272 kg (-9.6 oz) Last BM Date: 04/16/15  Intake/Output from previous day: 03/29 0701 - 03/30 0700 In: 1080 [P.O.:960; I.V.:120] Out: -   PHYSICAL EXAM General appearance: alert and no distress Resp: diminished breath sounds bibasilar and rhonchi bilaterally Cardio: S1, S2 normal GI: soft and lax, power sound is positive , no tenderness Extremities: extremities normal, atraumatic, no cyanosis or edema  Lab Results:  Results for orders placed or performed during the hospital encounter of 04/14/15 (from the past 48 hour(s))  C difficile quick scan w PCR reflex     Status: None   Collection Time: 04/16/15 10:46 AM  Result Value Ref Range   C Diff antigen NEGATIVE NEGATIVE   C Diff toxin NEGATIVE NEGATIVE   C Diff interpretation Negative for toxigenic C. difficile   Basic metabolic panel     Status: Abnormal   Collection Time: 04/17/15  6:31 AM  Result Value Ref Range   Sodium 135 135 - 145 mmol/L   Potassium 3.7 3.5 - 5.1 mmol/L   Chloride 104 101 - 111 mmol/L   CO2 24 22 - 32 mmol/L   Glucose, Bld 125 (H) 65 - 99 mg/dL   BUN 8 6 - 20 mg/dL   Creatinine, Ser 0.85 0.61 - 1.24 mg/dL   Calcium 8.0 (L) 8.9 - 10.3 mg/dL   GFR calc non Af Amer >60 >60 mL/min   GFR calc Af Amer >60 >60 mL/min    Comment: (NOTE) The eGFR has been calculated using the CKD EPI equation. This calculation has not been validated in all clinical situations. eGFR's persistently <60 mL/min signify possible Chronic Kidney Disease.    Anion gap 7 5 - 15    ABGS No  results for input(s): PHART, PO2ART, TCO2, HCO3 in the last 72 hours.  Invalid input(s): PCO2 CULTURES Recent Results (from the past 240 hour(s))  C difficile quick scan w PCR reflex     Status: None   Collection Time: 04/16/15 10:46 AM  Result Value Ref Range Status   C Diff antigen NEGATIVE NEGATIVE Final   C Diff toxin NEGATIVE NEGATIVE Final   C Diff interpretation Negative for toxigenic C. difficile  Final   Studies/Results: No results found.  Medications: I have reviewed the patient's current medications.  Assesment:   Principal Problem:   Colitis, acute Active Problems:   ETOH abuse   Essential hypertension   Cancer, hepatocellular (HCC)   Rectal bleeding    Plan:  Medications reviewed Will continue antibiotics GI consult appreciated Advance diet    LOS: 2 days   Huxley Shurley 04/17/2015, 7:38 AM

## 2015-04-17 NOTE — Progress Notes (Signed)
Patient seen earlier today.  Subjective:  Patient has no complaints. He denies nausea vomiting abdominal pain. States he had 2 stools earlier today these are semi-formed. He denies melena or rectal bleeding.   Objective: Blood pressure 97/61, pulse 79, temperature 98.3 F (36.8 C), temperature source Oral, resp. rate 18, height 5\' 9"  (1.753 m), weight 183 lb 6.4 oz (83.19 kg), SpO2 96 %. Patient is ambulating on the floor.   Labs/studies Results:   Recent Labs  04/14/15 2220 04/17/15 0631  WBC 4.6 3.4*  HGB 9.9* 10.2*  HCT 29.5* 31.6*  PLT 123* 130*    BMET   Recent Labs  04/14/15 2220 04/15/15 0612 04/17/15 0631  NA 135 139 135  K 3.6 3.4* 3.7  CL 104 108 104  CO2 23 25 24   GLUCOSE 133* 100* 125*  BUN 15 12 8   CREATININE 0.96 0.78 0.85  CALCIUM 8.3* 7.7* 8.0*    LFT   Recent Labs  04/14/15 2220  PROT 7.5  ALBUMIN 2.6*  AST 73*  ALT 37  ALKPHOS 128*  BILITOT 0.6    PT/INR   Recent Labs  04/14/15 2220  LABPROT 26.6*  INR 2.49*    Fecal lactoferrin positive   GI pathogen panel is pending.  Assessment:  #1.Proximal colitis most likely due to infection or food borne illness. Patient is tolerating full liquids. #2. Metastatic HCC. Patient is on palliative therapy with Nexavar. #3. Anemia. Anemia appears to be primarily due to chronic disease. No evidence of active GI bleed.   Recommendations:  DC IV antibiotics. Cipro 500 mg by mouth twice a day. Metronidazole 250 mg by mouth 3 times a day. INR with a.m. lab. Will advance diet to low residue diet in a.m.

## 2015-04-17 NOTE — Progress Notes (Signed)
Patient ID: [name], male   DOB: [birth year], 60 y.o.   MRN: [id number] States he feels better.  Tolerating diet. Blood pressure 97/61, pulse 79, temperature 98.3 F (36.8 C), temperature source Oral, resp. rate 18, height 5\' 9"  (1.753 m), weight 183 lb 6.4 oz (83.19 kg), SpO2 96 %. States he had 2 stools during the night non-bloody. No nausea or vomiting.  Denies any abdominal pain.  C. Diff negative.  CBC    Component Value Date/Time   WBC 3.4* 04/17/2015 0631   RBC 3.70* 04/17/2015 0631   RBC 3.37* 07/26/2007 0435   HGB 10.2* 04/17/2015 0631   HCT 31.6* 04/17/2015 0631   PLT 130* 04/17/2015 0631   MCV 85.4 04/17/2015 0631   MCH 27.6 04/17/2015 0631   MCHC 32.3 04/17/2015 0631   RDW 15.0 04/17/2015 0631   LYMPHSABS 1.4 03/02/2014 1932   MONOABS 0.6 03/02/2014 1932   EOSABS 0.1 03/02/2014 1932   BASOSABS 0.0 03/02/2014 1932   Assessment: Colitis: continue the Flagyl and Cipro. Will continue to montior.

## 2015-04-18 LAB — GASTROINTESTINAL PANEL BY PCR, STOOL (REPLACES STOOL CULTURE)
ASTROVIRUS: NOT DETECTED
Adenovirus F40/41: NOT DETECTED
CYCLOSPORA CAYETANENSIS: NOT DETECTED
Campylobacter species: NOT DETECTED
Cryptosporidium: NOT DETECTED
E. COLI O157: NOT DETECTED
ENTAMOEBA HISTOLYTICA: NOT DETECTED
ENTEROTOXIGENIC E COLI (ETEC): NOT DETECTED
Enteroaggregative E coli (EAEC): DETECTED — AB
Enteropathogenic E coli (EPEC): NOT DETECTED
Giardia lamblia: NOT DETECTED
NOROVIRUS GI/GII: NOT DETECTED
Plesimonas shigelloides: NOT DETECTED
ROTAVIRUS A: NOT DETECTED
SALMONELLA SPECIES: NOT DETECTED
SAPOVIRUS (I, II, IV, AND V): NOT DETECTED
Shiga like toxin producing E coli (STEC): NOT DETECTED
Shigella/Enteroinvasive E coli (EIEC): NOT DETECTED
VIBRIO CHOLERAE: NOT DETECTED
Vibrio species: NOT DETECTED
Yersinia enterocolitica: NOT DETECTED

## 2015-04-18 LAB — PROTIME-INR
INR: 1.55 — ABNORMAL HIGH (ref 0.00–1.49)
PROTHROMBIN TIME: 18.6 s — AB (ref 11.6–15.2)

## 2015-04-18 MED ORDER — METRONIDAZOLE 250 MG PO TABS: 250.0000 mg | ORAL_TABLET | Freq: Three times a day (TID) | ORAL | Status: DC

## 2015-04-18 MED ORDER — CIPROFLOXACIN HCL 500 MG PO TABS: 500.0000 mg | ORAL_TABLET | Freq: Two times a day (BID) | ORAL | Status: DC

## 2015-04-18 NOTE — Progress Notes (Signed)
Patient discharged with instructions, prescription, and care notes.  Verbalized understanding via teach back.  IV was removed and the site was WNL. Patient voiced no further complaints or concerns at the time of discharge.  Appointments scheduled per instructions.  Patient left the floor via w/c with staff and family in stable condition. 

## 2015-04-18 NOTE — Care Management Important Message (Signed)
Important Message  Patient Details  Name: DAXEN SCOLLON MRN: [id number] Date of Birth: July 06, 1955   Medicare Important Message Given:  Yes    [name], RN 04/18/2015, 8:47 AM

## 2015-04-18 NOTE — Care Management Note (Signed)
Case Management Note  Patient Details  Name: [name] MRN: [id number] Date of Birth: 09-Sep-1955  Subjective/Objective:      Spoke with patient who is alert and oriented from home. Stated that they can afford medications and are able to make thier medical appointments without difficulty. No DME or O2 at home. No CM needs identified.              Action/Plan:  Home with self care. Expected Discharge Date:                  Expected Discharge Plan:  Home/Self Care  In-House Referral:     Discharge planning Services  CM Consult  Post Acute Care Choice:    Choice offered to:     DME Arranged:    DME Agency:     HH Arranged:    [place] Agency:     Status of Service:  Completed, signed off  Medicare Important Message Given:  Yes Date Medicare IM Given:    Medicare IM give by:    Date Additional Medicare IM Given:    Additional Medicare Important Message give by:     If discussed at [place] of Stay Meetings, dates discussed:    Additional Comments:  Albert Heidelberg, RN 04/18/2015, 8:53 AM

## 2015-04-21 LAB — OVA + PARASITE EXAM

## 2015-04-21 LAB — O&P RESULT

## 2015-04-23 NOTE — Discharge Summary (Signed)
Physician Discharge Summary  Patient ID: [name] MRN: [id number] DOB/AGE: 1955/11/29 60 y.o. Primary Care Physician:Kaiea Esselman, MD Admit date: 04/14/2015 Discharge date: 04/18/2015   Discharge Diagnoses:   Principal Problem:   Colitis, acute Active Problems:   ETOH abuse   Essential hypertension   Cancer, hepatocellular ([place])   Rectal bleeding     Medication List    STOP taking these medications        ALEVE 220 MG tablet  Generic drug:  naproxen sodium     ibuprofen 200 MG tablet  Commonly known as:  ADVIL,MOTRIN     meloxicam 15 MG tablet  Commonly known as:  MOBIC     predniSONE 20 MG tablet  Commonly known as:  DELTASONE      TAKE these medications        albuterol 108 (90 Base) MCG/ACT inhaler  Commonly known as:  PROVENTIL HFA;VENTOLIN HFA  Inhale 2 puffs into the lungs 4 (four) times daily.     amLODipine 5 MG tablet  Commonly known as:  NORVASC  Take 5 mg by mouth daily.     atorvastatin 10 MG tablet  Commonly known as:  LIPITOR  Take 10 mg by mouth every evening.     ciprofloxacin 500 MG tablet  Commonly known as:  CIPRO  Take 1 tablet (500 mg total) by mouth 2 (two) times daily.     furosemide 20 MG tablet  Commonly known as:  LASIX  Take 20 mg by mouth daily.     gabapentin 600 MG tablet  Commonly known as:  NEURONTIN  Take 600 mg by mouth 3 (three) times daily.     HYDROcodone-acetaminophen 5-325 MG tablet  Commonly known as:  NORCO/VICODIN  Take 2 tablets by mouth every 4 (four) hours as needed for moderate pain.     hydroxypropyl methylcellulose / hypromellose 2.5 % ophthalmic solution  Commonly known as:  ISOPTO TEARS / GONIOVISC  Place 1 drop into both eyes daily as needed for dry eyes.     metroNIDAZOLE 250 MG tablet  Commonly known as:  FLAGYL  Take 1 tablet (250 mg total) by mouth every 8 (eight) hours.     omeprazole 40 MG capsule  Commonly known as:  PRILOSEC  Take 40 mg by mouth daily.     rivaroxaban 20 MG  Tabs tablet  Commonly known as:  XARELTO  Take 20 mg by mouth every evening.     SORAfenib 200 MG tablet  Commonly known as:  NEXAVAR  Take 400 mg by mouth 2 (two) times daily.     spironolactone 25 MG tablet  Commonly known as:  ALDACTONE  Take 25 mg by mouth 2 (two) times daily.        Discharged Condition: improved    Consults: GI  Significant Diagnostic Studies: Ct Abdomen Pelvis W Contrast  04/15/2015  CLINICAL DATA:  Acute onset of generalized abdominal pain. Dark stools. Patient on chemotherapy for liver cancer. Initial encounter. EXAM: CT ABDOMEN AND PELVIS WITH CONTRAST TECHNIQUE: Multidetector CT imaging of the abdomen and pelvis was performed using the standard protocol following bolus administration of intravenous contrast. CONTRAST:  37mL OMNIPAQUE IOHEXOL 300 MG/ML  SOLN COMPARISON:  CT the abdomen and pelvis performed 03/20/2014, and MRI of the abdomen performed 03/26/2014 FINDINGS: Worsening bilateral central airspace opacities and nodularity may reflect chronic infection. Metastatic disease cannot be entirely excluded, but is considered less likely given the appearance. A heterogeneous mass is noted occupying part of the  central right hepatic lobe, measuring up to 8 cm in size. The underlying nodular contour of the liver raises concern for hepatic cirrhosis. The spleen is unremarkable in appearance. The patient is status post cholecystectomy, with clips noted at the gallbladder fossa. The pancreas and adrenal glands are unremarkable. The kidneys are unremarkable in appearance. There is no evidence of hydronephrosis. No renal or ureteral stones are seen. No perinephric stranding is appreciated. No free fluid is identified. The small bowel is unremarkable in appearance. The stomach is within normal limits. No acute vascular abnormalities are seen. Scattered calcification is noted along the abdominal aorta and its branches. The appendix is normal in caliber and contains air,  without evidence of appendicitis. Mild wall thickening is noted along the ascending colon, with mild surrounding soft tissue inflammation, concerning for mild infectious or inflammatory colitis. The remainder of the colon is unremarkable in appearance. The bladder is mildly distended and grossly unremarkable. The prostate remains normal in size. There is superior displacement of the right testis, noted along the right inguinal canal. Postoperative change is noted at the left inguinal region. No inguinal lymphadenopathy is seen. No acute osseous abnormalities are identified. Multilevel vacuum phenomenon is noted along the lumbar spine. IMPRESSION: 1. Mild wall thickening along the ascending colon, with mild surrounding soft inflammation, concerning for infectious or inflammatory colitis. 2. 8 cm heterogeneous mass again noted occupying the central right hepatic lobe, reflecting the patient's known malignancy. This is significantly increased from prior studies. Underlying changes of hepatic cirrhosis. 3. Worsening bilateral central airspace opacities and nodularity may reflect chronic worsening infection. The appearance is less concerning for metastatic disease. 4. Scattered calcification along the abdominal aorta and its branches. 5. Superior displacement of the right testis, noted along the right inguinal canal. 6. Degenerative change along the lumbar spine. Electronically Signed   By: Garald Balding M.D.   On: 04/15/2015 00:22    Lab Results: Basic Metabolic Panel: No results for input(s): NA, K, CL, CO2, GLUCOSE, BUN, CREATININE, CALCIUM, MG, PHOS in the last 72 hours. Liver Function Tests: No results for input(s): AST, ALT, ALKPHOS, BILITOT, PROT, ALBUMIN in the last 72 hours.   CBC: No results for input(s): WBC, NEUTROABS, HGB, HCT, MCV, PLT in the last 72 hours.  Recent Results (from the past 240 hour(s))  C difficile quick scan w PCR reflex     Status: None   Collection Time: 04/16/15 10:46 AM   Result Value Ref Range Status   C Diff antigen NEGATIVE NEGATIVE Final   C Diff toxin NEGATIVE NEGATIVE Final   C Diff interpretation Negative for toxigenic C. difficile  Final  OVA + PARASITE EXAM     Status: None   Collection Time: 04/16/15 10:46 AM  Result Value Ref Range Status   OVA + PARASITE EXAM Final report  Final    Comment: (NOTE) These results were obtained using wet preparation(s) and trichrome stained smear. This test does not include testing for Cryptosporidium parvum, Cyclospora, or Microsporidia. Performed At: [facility] [place], [place] [id number] Lindon Romp MD [id number]    Source of Sample STOOL  Final  Gastrointestinal Panel by PCR , Stool     Status: Abnormal   Collection Time: 04/17/15  2:57 PM  Result Value Ref Range Status   Campylobacter species NOT DETECTED NOT DETECTED Final   Plesimonas shigelloides NOT DETECTED NOT DETECTED Final   Salmonella species NOT DETECTED NOT DETECTED Final   Yersinia enterocolitica NOT DETECTED NOT  DETECTED Final   Vibrio species NOT DETECTED NOT DETECTED Final   Vibrio cholerae NOT DETECTED NOT DETECTED Final   Enteroaggregative E coli (EAEC) DETECTED (A) NOT DETECTED Final    Comment: CRITICAL RESULT CALLED TO, READ BACK BY AND VERIFIED WITH: RN CAMIKA WATKINS 04/18/15 1500 MLM    Enteropathogenic E coli (EPEC) NOT DETECTED NOT DETECTED Final   Enterotoxigenic E coli (ETEC) NOT DETECTED NOT DETECTED Final   Shiga like toxin producing E coli (STEC) NOT DETECTED NOT DETECTED Final   E. coli O157 NOT DETECTED NOT DETECTED Final   Shigella/Enteroinvasive E coli (EIEC) NOT DETECTED NOT DETECTED Final   Cryptosporidium NOT DETECTED NOT DETECTED Final   Cyclospora cayetanensis NOT DETECTED NOT DETECTED Final   Entamoeba histolytica NOT DETECTED NOT DETECTED Final   Giardia lamblia NOT DETECTED NOT DETECTED Final   Adenovirus F40/41 NOT DETECTED NOT DETECTED Final   Astrovirus NOT DETECTED NOT  DETECTED Final   Norovirus GI/GII NOT DETECTED NOT DETECTED Final   Rotavirus A NOT DETECTED NOT DETECTED Final   Sapovirus (I, II, IV, and V) NOT DETECTED NOT DETECTED Final     Hospital Course:   This is a 60 years old male patient with history of multiple medical illnesses including  Metastatic ca of the liver was admitted due to acute colitis. Patient was started on IV antibiotics and he improved over the hospital stay. His antibiotics was changed to oral and patient discharged in stable condition.  Discharge Exam: Blood pressure 110/70, pulse 80, temperature 98 F (36.7 C), temperature source Oral, resp. rate 18, height 5\' 9"  (1.753 m), weight 81.511 kg (179 lb 11.2 oz), SpO2 98 %.   Disposition:  Home.        Follow-up Information    Follow up with [facility], MD On 04/25/2015.   Specialty:  Internal Medicine   Why:  at 9:00 am   Contact information:   [place] [id number] [phone]       Signed: Rosita Fire   04/23/2015, 8:22 AM

## 2015-05-07 ENCOUNTER — Encounter (INDEPENDENT_AMBULATORY_CARE_PROVIDER_SITE_OTHER): Payer: Self-pay | Admitting: Internal Medicine

## 2015-05-07 ENCOUNTER — Ambulatory Visit (INDEPENDENT_AMBULATORY_CARE_PROVIDER_SITE_OTHER): Payer: Medicare Other | Admitting: Internal Medicine

## 2015-06-18 ENCOUNTER — Encounter (HOSPITAL_COMMUNITY): Payer: Self-pay

## 2015-06-18 ENCOUNTER — Emergency Department (HOSPITAL_COMMUNITY): Payer: Medicare Other

## 2015-06-18 ENCOUNTER — Inpatient Hospital Stay (HOSPITAL_COMMUNITY)
Admission: EM | Admit: 2015-06-18 | Discharge: 2015-06-25 | DRG: 193 | Disposition: A | Discharge status: Home Health | Payer: Medicare Other | Attending: Internal Medicine | Admitting: Internal Medicine

## 2015-06-18 DIAGNOSIS — I820 Budd-Chiari syndrome: Secondary | ICD-10-CM | POA: Diagnosis present

## 2015-06-18 DIAGNOSIS — J189 Pneumonia, unspecified organism: Secondary | ICD-10-CM | POA: Diagnosis not present

## 2015-06-18 DIAGNOSIS — E785 Hyperlipidemia, unspecified: Secondary | ICD-10-CM | POA: Diagnosis present

## 2015-06-18 DIAGNOSIS — Z923 Personal history of irradiation: Secondary | ICD-10-CM

## 2015-06-18 DIAGNOSIS — R4182 Altered mental status, unspecified: Secondary | ICD-10-CM | POA: Diagnosis not present

## 2015-06-18 DIAGNOSIS — K7290 Hepatic failure, unspecified without coma: Secondary | ICD-10-CM | POA: Diagnosis present

## 2015-06-18 DIAGNOSIS — Z9221 Personal history of antineoplastic chemotherapy: Secondary | ICD-10-CM

## 2015-06-18 DIAGNOSIS — K7200 Acute and subacute hepatic failure without coma: Secondary | ICD-10-CM | POA: Diagnosis present

## 2015-06-18 DIAGNOSIS — E7800 Pure hypercholesterolemia, unspecified: Secondary | ICD-10-CM | POA: Diagnosis present

## 2015-06-18 DIAGNOSIS — C7951 Secondary malignant neoplasm of bone: Secondary | ICD-10-CM | POA: Diagnosis present

## 2015-06-18 DIAGNOSIS — I10 Essential (primary) hypertension: Secondary | ICD-10-CM

## 2015-06-18 DIAGNOSIS — J45909 Unspecified asthma, uncomplicated: Secondary | ICD-10-CM | POA: Diagnosis present

## 2015-06-18 DIAGNOSIS — G9340 Encephalopathy, unspecified: Secondary | ICD-10-CM | POA: Diagnosis present

## 2015-06-18 DIAGNOSIS — Z86718 Personal history of other venous thrombosis and embolism: Secondary | ICD-10-CM

## 2015-06-18 DIAGNOSIS — C220 Liver cell carcinoma: Secondary | ICD-10-CM | POA: Diagnosis not present

## 2015-06-18 DIAGNOSIS — R52 Pain, unspecified: Secondary | ICD-10-CM

## 2015-06-18 DIAGNOSIS — Z87891 Personal history of nicotine dependence: Secondary | ICD-10-CM

## 2015-06-18 DIAGNOSIS — K7682 Hepatic encephalopathy: Secondary | ICD-10-CM | POA: Diagnosis present

## 2015-06-18 DIAGNOSIS — Z7901 Long term (current) use of anticoagulants: Secondary | ICD-10-CM

## 2015-06-18 DIAGNOSIS — D649 Anemia, unspecified: Secondary | ICD-10-CM | POA: Diagnosis present

## 2015-06-18 DIAGNOSIS — E871 Hypo-osmolality and hyponatremia: Secondary | ICD-10-CM | POA: Diagnosis present

## 2015-06-18 DIAGNOSIS — M25562 Pain in left knee: Secondary | ICD-10-CM | POA: Diagnosis present

## 2015-06-18 DIAGNOSIS — M25462 Effusion, left knee: Secondary | ICD-10-CM | POA: Diagnosis present

## 2015-06-18 DIAGNOSIS — K219 Gastro-esophageal reflux disease without esophagitis: Secondary | ICD-10-CM | POA: Diagnosis present

## 2015-06-18 DIAGNOSIS — Y95 Nosocomial condition: Secondary | ICD-10-CM | POA: Diagnosis present

## 2015-06-18 DIAGNOSIS — E440 Moderate protein-calorie malnutrition: Secondary | ICD-10-CM | POA: Diagnosis present

## 2015-06-18 DIAGNOSIS — Z85818 Personal history of malignant neoplasm of other sites of lip, oral cavity, and pharynx: Secondary | ICD-10-CM

## 2015-06-18 DIAGNOSIS — R040 Epistaxis: Secondary | ICD-10-CM | POA: Diagnosis present

## 2015-06-18 DIAGNOSIS — F1010 Alcohol abuse, uncomplicated: Secondary | ICD-10-CM

## 2015-06-18 DIAGNOSIS — M25461 Effusion, right knee: Secondary | ICD-10-CM | POA: Diagnosis present

## 2015-06-18 LAB — HEPATIC FUNCTION PANEL
ALT: 64 U/L — AB (ref 17–63)
AST: 158 U/L — AB (ref 15–41)
Albumin: 2 g/dL — ABNORMAL LOW (ref 3.5–5.0)
Alkaline Phosphatase: 242 U/L — ABNORMAL HIGH (ref 38–126)
BILIRUBIN DIRECT: 1.2 mg/dL — AB (ref 0.1–0.5)
BILIRUBIN INDIRECT: 1.7 mg/dL — AB (ref 0.3–0.9)
TOTAL PROTEIN: 8.1 g/dL (ref 6.5–8.1)
Total Bilirubin: 2.9 mg/dL — ABNORMAL HIGH (ref 0.3–1.2)

## 2015-06-18 LAB — URINALYSIS, ROUTINE W REFLEX MICROSCOPIC
BILIRUBIN URINE: NEGATIVE
GLUCOSE, UA: 100 mg/dL — AB
HGB URINE DIPSTICK: NEGATIVE
Leukocytes, UA: NEGATIVE
Nitrite: NEGATIVE
Protein, ur: NEGATIVE mg/dL
SPECIFIC GRAVITY, URINE: 1.02 (ref 1.005–1.030)
pH: 6.5 (ref 5.0–8.0)

## 2015-06-18 LAB — BASIC METABOLIC PANEL
Anion gap: 7 (ref 5–15)
BUN: 8 mg/dL (ref 6–20)
CO2: 22 mmol/L (ref 22–32)
CREATININE: 0.66 mg/dL (ref 0.61–1.24)
Calcium: 8.2 mg/dL — ABNORMAL LOW (ref 8.9–10.3)
Chloride: 103 mmol/L (ref 101–111)
GFR calc Af Amer: >60 mL/min (ref >60)
GLUCOSE: 68 mg/dL (ref 65–99)
POTASSIUM: 4 mmol/L (ref 3.5–5.1)
SODIUM: 132 mmol/L — AB (ref 135–145)

## 2015-06-18 LAB — RAPID URINE DRUG SCREEN, HOSP PERFORMED
Amphetamines: NOT DETECTED
BARBITURATES: NOT DETECTED
Benzodiazepines: NOT DETECTED
COCAINE: NOT DETECTED
Opiates: NOT DETECTED
TETRAHYDROCANNABINOL: NOT DETECTED

## 2015-06-18 LAB — CBC WITH DIFFERENTIAL/PLATELET
Basophils Absolute: 0 10*3/uL (ref 0.0–0.1)
Basophils Relative: 1 %
EOS ABS: 0.1 10*3/uL (ref 0.0–0.7)
EOS PCT: 1 %
HCT: 34.4 % — ABNORMAL LOW (ref 39.0–52.0)
Hemoglobin: 11.3 g/dL — ABNORMAL LOW (ref 13.0–17.0)
LYMPHS ABS: 0.9 10*3/uL (ref 0.7–4.0)
LYMPHS PCT: 13 %
MCH: 26.1 pg (ref 26.0–34.0)
MCHC: 32.8 g/dL (ref 30.0–36.0)
MCV: 79.4 fL (ref 78.0–100.0)
MONO ABS: 0.7 10*3/uL (ref 0.1–1.0)
MONOS PCT: 10 %
Neutro Abs: 5 10*3/uL (ref 1.7–7.7)
Neutrophils Relative %: 76 %
PLATELETS: 207 10*3/uL (ref 150–400)
RBC: 4.33 MIL/uL (ref 4.22–5.81)
RDW: 20.2 % — ABNORMAL HIGH (ref 11.5–15.5)
WBC: 6.6 10*3/uL (ref 4.0–10.5)

## 2015-06-18 LAB — ETHANOL: Alcohol, Ethyl (B): <5 mg/dL (ref <5)

## 2015-06-18 LAB — PROTIME-INR
INR: 1.37 (ref 0.00–1.49)
PROTHROMBIN TIME: 17 s — AB (ref 11.6–15.2)

## 2015-06-18 LAB — LACTIC ACID, PLASMA: LACTIC ACID, VENOUS: 3.4 mmol/L — AB (ref 0.5–2.0)

## 2015-06-18 LAB — PROCALCITONIN: Procalcitonin: 0.13 ng/mL

## 2015-06-18 LAB — AMMONIA: Ammonia: 117 umol/L — ABNORMAL HIGH (ref 9–35)

## 2015-06-18 MED ORDER — ENSURE ENLIVE PO LIQD
237.0000 mL | Freq: Two times a day (BID) | ORAL | Status: DC
  Administered 2015-06-19: 237 mL via ORAL

## 2015-06-18 MED ORDER — FUROSEMIDE 40 MG PO TABS
20.0000 mg | ORAL_TABLET | Freq: Every day | ORAL | Status: DC
  Administered 2015-06-19 – 2015-06-25 (×7): 20 mg via ORAL
  Filled 2015-06-18 (×8): qty 1

## 2015-06-18 MED ORDER — VANCOMYCIN HCL IN DEXTROSE 1-5 GM/200ML-% IV SOLN
1000.0000 mg | Freq: Three times a day (TID) | INTRAVENOUS | Status: DC
  Administered 2015-06-19 (×2): 1000 mg via INTRAVENOUS
  Filled 2015-06-18 (×2): qty 200

## 2015-06-18 MED ORDER — DEXTROSE 5 % IV SOLN
500.0000 mg | Freq: Once | INTRAVENOUS | Status: AC
  Administered 2015-06-18: 500 mg via INTRAVENOUS
  Filled 2015-06-18 (×2): qty 500

## 2015-06-18 MED ORDER — VANCOMYCIN HCL IN DEXTROSE 1-5 GM/200ML-% IV SOLN
1000.0000 mg | Freq: Once | INTRAVENOUS | Status: AC
  Administered 2015-06-18: 1000 mg via INTRAVENOUS
  Filled 2015-06-18: qty 200

## 2015-06-18 MED ORDER — POLYVINYL ALCOHOL 1.4 % OP SOLN
1.0000 [drp] | Freq: Every day | OPHTHALMIC | Status: DC | PRN
  Filled 2015-06-18: qty 15

## 2015-06-18 MED ORDER — ADULT MULTIVITAMIN W/MINERALS CH
1.0000 | ORAL_TABLET | Freq: Every day | ORAL | Status: DC
  Administered 2015-06-19 – 2015-06-25 (×7): 1 via ORAL
  Filled 2015-06-18 (×8): qty 1

## 2015-06-18 MED ORDER — ALBUTEROL SULFATE (2.5 MG/3ML) 0.083% IN NEBU
3.0000 mL | INHALATION_SOLUTION | RESPIRATORY_TRACT | Status: DC | PRN
  Administered 2015-06-22: 3 mL via RESPIRATORY_TRACT
  Filled 2015-06-18: qty 3

## 2015-06-18 MED ORDER — DEXTROSE 5 % IV SOLN
1.0000 g | Freq: Three times a day (TID) | INTRAVENOUS | Status: DC
  Administered 2015-06-18 – 2015-06-25 (×20): 1 g via INTRAVENOUS
  Filled 2015-06-18 (×24): qty 1

## 2015-06-18 MED ORDER — AMLODIPINE BESYLATE 5 MG PO TABS
5.0000 mg | ORAL_TABLET | Freq: Every day | ORAL | Status: DC
  Administered 2015-06-18 – 2015-06-25 (×8): 5 mg via ORAL
  Filled 2015-06-18 (×9): qty 1

## 2015-06-18 MED ORDER — SODIUM CHLORIDE 0.9% FLUSH: 3.0000 mL | INTRAVENOUS | Status: DC | PRN

## 2015-06-18 MED ORDER — IBUPROFEN 400 MG PO TABS
400.0000 mg | ORAL_TABLET | Freq: Once | ORAL | Status: AC
  Administered 2015-06-18: 400 mg via ORAL
  Filled 2015-06-18: qty 1

## 2015-06-18 MED ORDER — LORAZEPAM 1 MG PO TABS
1.0000 mg | ORAL_TABLET | Freq: Four times a day (QID) | ORAL | Status: AC | PRN
  Administered 2015-06-20: 1 mg via ORAL
  Filled 2015-06-18: qty 1

## 2015-06-18 MED ORDER — RIVAROXABAN 20 MG PO TABS
20.0000 mg | ORAL_TABLET | Freq: Every evening | ORAL | Status: DC
  Administered 2015-06-18 – 2015-06-24 (×7): 20 mg via ORAL
  Filled 2015-06-18 (×7): qty 1

## 2015-06-18 MED ORDER — DEXTROSE 5 % IV SOLN
INTRAVENOUS | Status: AC
  Filled 2015-06-18 (×2): qty 1

## 2015-06-18 MED ORDER — GABAPENTIN 300 MG PO CAPS
600.0000 mg | ORAL_CAPSULE | Freq: Three times a day (TID) | ORAL | Status: DC
  Administered 2015-06-18 – 2015-06-25 (×20): 600 mg via ORAL
  Filled 2015-06-18 (×20): qty 2

## 2015-06-18 MED ORDER — THIAMINE HCL 100 MG/ML IJ SOLN
100.0000 mg | Freq: Every day | INTRAMUSCULAR | Status: DC
  Administered 2015-06-19 – 2015-06-24 (×3): 100 mg via INTRAVENOUS
  Filled 2015-06-18 (×3): qty 2

## 2015-06-18 MED ORDER — SORAFENIB TOSYLATE 200 MG PO TABS: 200.0000 mg | ORAL_TABLET | Freq: Every day | ORAL | Status: DC

## 2015-06-18 MED ORDER — FOLIC ACID 1 MG PO TABS
1.0000 mg | ORAL_TABLET | Freq: Every day | ORAL | Status: DC
  Administered 2015-06-18 – 2015-06-25 (×8): 1 mg via ORAL
  Filled 2015-06-18 (×9): qty 1

## 2015-06-18 MED ORDER — ATORVASTATIN CALCIUM 10 MG PO TABS
10.0000 mg | ORAL_TABLET | Freq: Every evening | ORAL | Status: DC
  Administered 2015-06-18 – 2015-06-24 (×7): 10 mg via ORAL
  Filled 2015-06-18 (×7): qty 1

## 2015-06-18 MED ORDER — SODIUM CHLORIDE 0.9 % IV BOLUS (SEPSIS)
500.0000 mL | Freq: Once | INTRAVENOUS | Status: AC
  Administered 2015-06-18: 500 mL via INTRAVENOUS

## 2015-06-18 MED ORDER — LACTULOSE 10 GM/15ML PO SOLN: 20.0000 g | Freq: Three times a day (TID) | ORAL | Status: DC

## 2015-06-18 MED ORDER — LACTULOSE 10 GM/15ML PO SOLN
20.0000 g | Freq: Three times a day (TID) | ORAL | Status: DC
  Administered 2015-06-18 – 2015-06-25 (×20): 20 g via ORAL
  Filled 2015-06-18 (×18): qty 30

## 2015-06-18 MED ORDER — VITAMIN B-1 100 MG PO TABS
100.0000 mg | ORAL_TABLET | Freq: Every day | ORAL | Status: DC
  Administered 2015-06-18 – 2015-06-25 (×5): 100 mg via ORAL
  Filled 2015-06-18 (×6): qty 1

## 2015-06-18 MED ORDER — SODIUM CHLORIDE 0.9% FLUSH
3.0000 mL | Freq: Two times a day (BID) | INTRAVENOUS | Status: DC
Start: 2015-06-18 — End: 2015-06-25
  Administered 2015-06-18 – 2015-06-24 (×7): 3 mL via INTRAVENOUS

## 2015-06-18 MED ORDER — LORAZEPAM 2 MG/ML IJ SOLN: 1.0000 mg | Freq: Four times a day (QID) | INTRAMUSCULAR | Status: AC | PRN

## 2015-06-18 MED ORDER — DEXTROSE 5 % IV SOLN
1.0000 g | Freq: Once | INTRAVENOUS | Status: AC
  Administered 2015-06-18: 1 g via INTRAVENOUS
  Filled 2015-06-18: qty 10

## 2015-06-18 MED ORDER — PANTOPRAZOLE SODIUM 40 MG PO TBEC
40.0000 mg | DELAYED_RELEASE_TABLET | Freq: Every day | ORAL | Status: DC
  Administered 2015-06-18 – 2015-06-25 (×8): 40 mg via ORAL
  Filled 2015-06-18 (×9): qty 1

## 2015-06-18 MED ORDER — SODIUM CHLORIDE 0.9 % IV SOLN
250.0000 mL | INTRAVENOUS | Status: DC | PRN
  Administered 2015-06-19: 250 mL via INTRAVENOUS

## 2015-06-18 MED ORDER — SPIRONOLACTONE 25 MG PO TABS
25.0000 mg | ORAL_TABLET | Freq: Two times a day (BID) | ORAL | Status: DC
  Administered 2015-06-18 – 2015-06-25 (×14): 25 mg via ORAL
  Filled 2015-06-18 (×15): qty 1

## 2015-06-18 MED ORDER — SODIUM CHLORIDE 0.9 % IV SOLN
Freq: Once | INTRAVENOUS | Status: AC
  Administered 2015-06-18: 18:00:00 via INTRAVENOUS

## 2015-06-18 NOTE — Progress Notes (Signed)
Pharmacy Antibiotic Note  [name] is a 59 y.o. male admitted on 06/18/2015 with pneumonia.  Pharmacy has been consulted for Vancomycin and Cefepime (renal per MD) dosing.  Plan: Vancomycin 1gm IV every 8 hours.  Goal trough 15-20 mcg/mL. Monitor labs, micro and vitals.  Cefepime dose OK  Height: 5\' 8"  (172.7 cm) Weight: 179 lb (81.194 kg) IBW/kg (Calculated) : 68.4  Temp (24hrs), Avg:98.1 F (36.7 C), Min:97.6 F (36.4 C), Max:98.5 F (36.9 C)   Recent Labs Lab 06/18/15 1319  WBC 6.6  CREATININE 0.66    Estimated Creatinine Clearance: 96.2 mL/min (by C-G formula based on Cr of 0.66).    No Known Allergies  Antimicrobials this admission: Vanc 5/31 >>  Cefepime 5/31 >>   Dose adjustments this admission: N/A  Microbiology results: 5/31 BCx: Pending MRSA PCR:   Thank you for allowing pharmacy to be a part of this patient's care.  Pricilla Larsson 06/18/2015 7:28 PM

## 2015-06-18 NOTE — ED Notes (Signed)
Pt's brother reports that pt lives alone and the last time he saw him "normal" was 3 days ago.  He noticed he was confused today when he went over there.

## 2015-06-18 NOTE — ED Notes (Signed)
Attempted report. Day shift RN reported would tell night shift to call back for report once available.

## 2015-06-18 NOTE — H&P (Signed)
History and Physical    [name] [id number] DOB: 14-Jul-1955 DOA: 06/18/2015  PCP: Rosita Fire, MD   Patient coming from: Home   Chief Complaint: Confusion, gen weakness  HPI: [name] is a 60 y.o. 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 Patient reportedly lives alone and had last been seen 3 days prior to admission, reportedly in his normal state at that time. Patient is undergoing treatment with oral chemotherapeutic for hepatocellular carcinoma through [facility]. He had been admitted to this institution for rectal bleeding 2 months ago and had reportedly been doing well at home following that discharge. History is very limited given the patient's confusion but he is able to deny chest pain, palpitations, abdominal pain, nausea, vomiting, or diarrhea. He reports pain in the bilateral lower extremities which she says has been there for months.  ED Course: Upon arrival to the ED, patient is found to be afebrile, saturating adequately on room air, and with remaining vital signs stable. EKG features a sinus rhythm with low-voltage QRS. Chest x-ray demonstrates a left upper lobe infiltrate suspicious for pneumonia. Head CT is negative for acute intracranial abnormality. CMP features albumin of 2.0, mild transaminase elevations, and total bilirubin of 2.9. CBC features a normocytic anemia with hemoglobin of 11.3, higher than recent priors. Ethanol level is < 5 and UDS is negative. Urinalysis is grossly negative for infection. 500 mL normal saline was bolused in the ED and the patient was continued on IV fluid hydration. Blood cultures were obtained and empiric treatment with Rocephin and azithromycin was administered. Patient has remained hemodynamically stable  in the emergency department and will be admitted to the hospital for ongoing evaluation and management of acute encephalopathy with suspected HCAP.  Review of Systems:  Unable to obtain ROS secondary to patient's clinical condition with acute encephalopathy.  Past Medical History  Diagnosis Date  . Asthma   . Cancer ([place])   . Hypertension   . Hyperlipidemia   . 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 Tonsil cancer ([place])   . Liver cancer [facility])     Past Surgical History  Procedure Laterality Date  . Cholecystectomy    . Surgery to remove cancer from neck      6 yrs ago  . Hepatic artery embolization  07/18/14    [facility]     reports that he quit smoking about 8 years ago. His smoking use included Cigarettes. He has a 3.5 pack-year smoking history. He has never used smokeless tobacco. He reports that he drinks alcohol. He reports that he does not use illicit drugs.  No Known Allergies  Family History  Problem Relation Age of Onset  . Family history unknown: Yes     Prior to Admission medications   Medication Sig Start Date End Date Taking? Authorizing Provider  albuterol (PROVENTIL HFA;VENTOLIN HFA) 108 (90 BASE) MCG/ACT inhaler Inhale 2 puffs into the lungs 4 (four) times daily.     Historical Provider, MD  amLODipine (NORVASC) 5 MG tablet Take 5 mg by mouth daily. 03/02/14   Historical Provider, MD  atorvastatin (LIPITOR) 10 MG tablet Take 10 mg by mouth every evening. 04/05/15   Historical Provider, MD  ciprofloxacin (CIPRO) 500 MG tablet Take 1 tablet (500 mg total) by mouth 2 (two) times  daily. 04/18/15   Rosita Fire, MD  furosemide (LASIX) 20 MG tablet Take 20 mg by mouth daily. 07/27/14   Historical Provider, MD  gabapentin (NEURONTIN) 600 MG tablet Take 600 mg by mouth 3 (three) times daily.      Historical Provider, MD  HYDROcodone-acetaminophen (NORCO/VICODIN) 5-325 MG tablet Take 2 tablets by mouth every 4 (four) hours as needed for moderate pain. Patient not taking: Reported on  04/14/2015 02/22/15   Orpah Greek, MD  hydroxypropyl methylcellulose / hypromellose (ISOPTO TEARS / GONIOVISC) 2.5 % ophthalmic solution Place 1 drop into both eyes daily as needed for dry eyes.    Historical Provider, MD  metroNIDAZOLE (FLAGYL) 250 MG tablet Take 1 tablet (250 mg total) by mouth every 8 (eight) hours. 04/18/15   Rosita Fire, MD  omeprazole (PRILOSEC) 40 MG capsule Take 40 mg by mouth daily.      Historical Provider, MD  rivaroxaban (XARELTO) 20 MG TABS tablet Take 20 mg by mouth every evening. 04/10/15 04/09/16  Historical Provider, MD  SORAfenib (NEXAVAR) 200 MG tablet Take 400 mg by mouth 2 (two) times daily. 04/02/15 04/01/16  Historical Provider, MD  spironolactone (ALDACTONE) 25 MG tablet Take 25 mg by mouth 2 (two) times daily.    Historical Provider, MD    Physical Exam: Filed Vitals:   06/18/15 1715 06/18/15 1730 06/18/15 1800 06/18/15 1815  BP:  128/75 133/80   Pulse: 73   75  Temp:      TempSrc:      Resp: 13 16 17 19   Height:      Weight:      SpO2: 100%   100%      Constitutional: NAD, calm, comfortable. Chronically-ill appearing. Appears older than stated age.  Eyes: PERTLA, lids and conjunctivae normal ENMT: Mucous membranes are dry and tacky. Posterior pharynx clear of any exudate or lesions.   Neck: normal, supple, no masses, no thyromegaly Respiratory: Coarse rhonchi at both apices and left mid-lung. Normal respiratory effort. No accessory muscle use.  Cardiovascular: S1 & S2 heard, regular rate and rhythm. No extremity edema. No significant JVD. Abdomen: No distension, no tenderness, no masses palpated. Bowel sounds normal.  Musculoskeletal: no clubbing / cyanosis. No joint deformity upper and lower extremities. Normal muscle tone.  Skin: no significant rashes, lesions, ulcers. Warm, dry, well-perfused. Neurologic: CN 2-12 grossly intact. Sensation intact, DTR normal. Strength 5/5 in all 4 limbs. Somnolent, arousable. Oriented to person and  place only.  Psychiatric: Difficult to assess given the patient's clinical condition with somnolence and confusion.     Labs on Admission: I have personally reviewed following labs and imaging studies  CBC:  Recent Labs Lab 06/18/15 1319  WBC 6.6  NEUTROABS 5.0  HGB 11.3*  HCT 34.4*  MCV 79.4  PLT [id number]   Basic Metabolic Panel:  Recent Labs Lab 06/18/15 1319  NA 132*  K 4.0  CL 103  CO2 22  GLUCOSE 68  BUN 8  CREATININE 0.66  CALCIUM 8.2*   GFR: Estimated Creatinine Clearance: 96.2 mL/min (by C-G formula based on Cr of 0.66). Liver Function Tests:  Recent Labs Lab 06/18/15 1319  AST 158*  ALT 64*  ALKPHOS 242*  BILITOT 2.9*  PROT 8.1  ALBUMIN 2.0*   No results for input(s): LIPASE, AMYLASE in the last 168 hours.  Recent Labs Lab 06/18/15 1413  AMMONIA 117*   Coagulation Profile:  Recent Labs Lab 06/18/15 1413  INR 1.37   Cardiac Enzymes: No results for  input(s): CKTOTAL, CKMB, CKMBINDEX, TROPONINI in the last 168 hours. BNP (last 3 results) No results for input(s): PROBNP in the last 8760 hours. HbA1C: No results for input(s): HGBA1C in the last 72 hours. CBG: No results for input(s): GLUCAP in the last 168 hours. Lipid Profile: No results for input(s): CHOL, HDL, LDLCALC, TRIG, CHOLHDL, LDLDIRECT in the last 72 hours. Thyroid Function Tests: No results for input(s): TSH, T4TOTAL, FREET4, T3FREE, THYROIDAB in the last 72 hours. Anemia Panel: No results for input(s): VITAMINB12, FOLATE, FERRITIN, TIBC, IRON, RETICCTPCT in the last 72 hours. Urine analysis:    Component Value Date/Time   COLORURINE YELLOW 06/18/2015 [place] 06/18/2015 1522   LABSPEC 1.020 06/18/2015 1522   PHURINE 6.5 06/18/2015 1522   GLUCOSEU 100* 06/18/2015 1522   HGBUR NEGATIVE 06/18/2015 1522   BILIRUBINUR NEGATIVE 06/18/2015 1522   KETONESUR TRACE* 06/18/2015 1522   PROTEINUR NEGATIVE 06/18/2015 1522   UROBILINOGEN 0.2 07/15/2007 0638    NITRITE NEGATIVE 06/18/2015 1522   LEUKOCYTESUR NEGATIVE 06/18/2015 1522   Sepsis Labs: @LABRCNTIP (procalcitonin:4,lacticidven:4) )No results found for this or any previous visit (from the past 240 hour(s)).   Radiological Exams on Admission: Ct Head Wo Contrast  06/18/2015  CLINICAL DATA:  Recent onset confusion/altered mental status. History of tonsil carcinoma EXAM: CT HEAD WITHOUT CONTRAST TECHNIQUE: Contiguous axial images were obtained from the base of the skull through the vertex without intravenous contrast. COMPARISON:  None. FINDINGS: The ventricles are normal in size and configuration. The left lateral ventricle is slightly larger than the right lateral ventricle, an anatomic variant. There is no intracranial mass, hemorrhage, extra-axial fluid collection, or midline shift. Gray-white compartments appear normal. No acute infarct evident. Bony calvarium appears intact. The mastoid air cells are clear. No intraorbital lesions are evident. IMPRESSION: Study within normal limits. Electronically Signed   By: Lowella Grip III M.D.   On: 06/18/2015 12:54   Dg Chest Port 1 View  06/18/2015  CLINICAL DATA:  Confusion and cough EXAM: PORTABLE CHEST 1 VIEW COMPARISON:  03/02/2014 FINDINGS: Cardiac shadow is within normal limits. The right lung is clear. Increased infiltrate is noted in the left upper lobe. No sizable effusion is seen. No bony abnormality is noted. IMPRESSION: Left upper lobe infiltrate. Followup PA and lateral chest X-ray is recommended in 3-4 weeks following trial of antibiotic therapy to ensure resolution and exclude underlying malignancy. Electronically Signed   By: Inez Catalina M.D.   On: 06/18/2015 14:25    EKG: Independently reviewed. Sinus rhythm, low-voltage QRS  Assessment/Plan  1. Acute encephalopathy  - Likely hepatic encephalopathy given known liver disease and ammonia level 117 on admission  - PNA could also be contributing; treating as below  - Start  lactulose 20 g TID, titrated to effect   2. ?HCAP - LUL infiltrate on CXR concerning for PNA, treated as HCAP given recent hospital admission  - Clinically, patient is saturating well on rm air with no apparent distress  - There is no leukocytosis on admission; lactic acid pending  - Blood cultures incubating, sputum culture requested  - Check urine for antigens to strep pneumo and legionella  - Treat empirically with vanc and cefepime for now, tailor according to culture data and clinical course  - Underlying mass cannot be excluded and f/u chest imaging advised   3. Alcohol abuse  - Pt unable to give a clear answer about recent use at time of admission  - EtOH level <5 on admission  - Monitor  with CIWA and prn Ativan  - Daily vitamins    4. Hepatocellular carcinoma, metastatic  - Under treatment with regorafenib (oral tyrosine kinase inhibitor) through [place] regorafenib for now as unclear whether apparently worsening liver function is d/t disease progression   5. Hypertension  - At goal currently  - Managed with Norvasc, Lasix, Aldactone at home  - Continue current-management   6. Anemia, normocytic   - Hgb 11.3 on admission, higher than recent priors  - No sign of active blood-loss  - Monitor   7. Hepatic vein thrombosis  - Managed with Xarelto, will continue    DVT prophylaxis: Continue Xarelto  Code Status: Full  Family Communication: None available   Disposition Plan: Admit to med-surg  Consults called: None Admission status: Inpatient   Vianne Bulls, MD [facility] Pager [phone]  If 7PM-7AM, please contact night-coverage [email or web address] Password [facility]  06/18/2015, 6:54 PM

## 2015-06-18 NOTE — ED Notes (Signed)
Attempted IV x2. Charge RN aware and reported would attempt to obtain IV access site.

## 2015-06-18 NOTE — ED Provider Notes (Signed)
CSN: [id number]     Arrival date & time 06/18/15  1142 History   First MD Initiated Contact with Patient 06/18/15 1337     Chief Complaint  Patient presents with  . Claudication  . Altered Mental Status     \   HPI  Patient presents for evaluation via EMS after being found at home confused by his brother.  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  His brother,:, Last saw him 3 days ago and said he was normal. He drove to the patient's house today. He had a "lady friend" within the way to the door. The patient did not initially come to the door. However, he was altered and unable to walk due to was went back and laid in bed and stated that his legs hurt. Brother felt that he was confused. Patient had difficulty following a conversation. He presents here.  Denies any fall or injury. He was not laying on the floor incapacitated. He was able to walk to the door when the brother arrived  Past Medical History  Diagnosis Date  . Asthma   . Cancer ([place])   . Hypertension   . Hyperlipidemia   . 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 Tonsil cancer ([place])   . Liver cancer [facility])    Past Surgical History  Procedure Laterality Date  . Cholecystectomy    . Surgery to remove cancer from neck      6 yrs ago  . Hepatic artery embolization  07/18/14    [facility]   Family History  Problem Relation Age of Onset  . Family history unknown: Yes   Social History  Substance Use Topics  . Smoking status: Former Smoker -- 0.25 packs/day for 14 years    Types: Cigarettes    Quit date: 01/19/2007  . Smokeless tobacco: Never Used     Comment: smokes very little.  . Alcohol Use: 0.0 oz/week    0 Standard drinks or equivalent per week     Comment: quit recently due to liver ca. Was  drinking 1/2 pint per day.    Review of Systems    Allergies  Review of patient's allergies indicates no known allergies.  Home Medications   Prior to Admission medications   Medication Sig Start Date End Date Taking? Authorizing Provider  albuterol (PROVENTIL HFA;VENTOLIN HFA) 108 (90 BASE) MCG/ACT inhaler Inhale 2 puffs into the lungs 4 (four) times daily.     Historical Provider, MD  amLODipine (NORVASC) 5 MG tablet Take 5 mg by mouth daily. 03/02/14   Historical Provider, MD  atorvastatin (LIPITOR) 10 MG tablet Take 10 mg by mouth every evening. 04/05/15   Historical Provider, MD  ciprofloxacin (CIPRO) 500 MG tablet Take 1 tablet (500 mg total) by mouth 2 (two) times daily. 04/18/15   Rosita Fire, MD  furosemide (LASIX) 20 MG tablet Take 20 mg by mouth daily. 07/27/14   Historical Provider, MD  gabapentin (NEURONTIN) 600 MG tablet Take 600 mg by mouth 3 (three) times daily.      Historical Provider, MD  HYDROcodone-acetaminophen (NORCO/VICODIN) 5-325 MG tablet Take 2 tablets by mouth every 4 (four) hours as needed for moderate pain. Patient not taking: Reported on 04/14/2015 02/22/15   Orpah Greek, MD  hydroxypropyl methylcellulose / hypromellose (ISOPTO TEARS / GONIOVISC) 2.5 % ophthalmic solution  Place 1 drop into both eyes daily as needed for dry eyes.    Historical Provider, MD  metroNIDAZOLE (FLAGYL) 250 MG tablet Take 1 tablet (250 mg total) by mouth every 8 (eight) hours. 04/18/15   Rosita Fire, MD  omeprazole (PRILOSEC) 40 MG capsule Take 40 mg by mouth daily.      Historical Provider, MD  rivaroxaban (XARELTO) 20 MG TABS tablet Take 20 mg by mouth every evening. 04/10/15 04/09/16  Historical Provider, MD  SORAfenib (NEXAVAR) 200 MG tablet Take 400 mg by mouth 2 (two) times daily. 04/02/15 04/01/16  Historical Provider, MD  spironolactone (ALDACTONE) 25 MG tablet Take 25 mg by mouth 2 (two) times daily.    Historical Provider, MD   BP 149/86 mmHg  Pulse 72  Temp(Src) 97.6  F (36.4 C) (Rectal)  Resp 15  Ht 5\' 8"  (1.727 m)  Wt 179 lb (81.194 kg)  BMI 27.22 kg/m2  SpO2 100% Physical Exam  ED Course  Procedures (including critical care time) Labs Review Labs Reviewed  CBC WITH DIFFERENTIAL/PLATELET - Abnormal; Notable for the following:    Hemoglobin 11.3 (*)    HCT 34.4 (*)    RDW 20.2 (*)    All other components within normal limits  BASIC METABOLIC PANEL - Abnormal; Notable for the following:    Sodium 132 (*)    Calcium 8.2 (*)    All other components within normal limits  URINALYSIS, ROUTINE W REFLEX MICROSCOPIC (NOT AT [facility]) - Abnormal; Notable for the following:    Glucose, UA 100 (*)    Ketones, ur TRACE (*)    All other components within normal limits  AMMONIA - Abnormal; Notable for the following:    Ammonia 117 (*)    All other components within normal limits  PROTIME-INR - Abnormal; Notable for the following:    Prothrombin Time 17.0 (*)    All other components within normal limits  HEPATIC FUNCTION PANEL - Abnormal; Notable for the following:    Albumin 2.0 (*)    AST 158 (*)    ALT 64 (*)    Alkaline Phosphatase 242 (*)    Total Bilirubin 2.9 (*)    Bilirubin, Direct 1.2 (*)    Indirect Bilirubin 1.7 (*)    All other components within normal limits  CULTURE, BLOOD (ROUTINE X 2)  CULTURE, BLOOD (ROUTINE X 2)  ETHANOL  URINE RAPID DRUG SCREEN, HOSP PERFORMED    Imaging Review Ct Head Wo Contrast  06/18/2015  CLINICAL DATA:  Recent onset confusion/altered mental status. History of tonsil carcinoma EXAM: CT HEAD WITHOUT CONTRAST TECHNIQUE: Contiguous axial images were obtained from the base of the skull through the vertex without intravenous contrast. COMPARISON:  None. FINDINGS: The ventricles are normal in size and configuration. The left lateral ventricle is slightly larger than the right lateral ventricle, an anatomic variant. There is no intracranial mass, hemorrhage, extra-axial fluid collection, or midline shift.  Gray-white compartments appear normal. No acute infarct evident. Bony calvarium appears intact. The mastoid air cells are clear. No intraorbital lesions are evident. IMPRESSION: Study within normal limits. Electronically Signed   By: Lowella Grip III M.D.   On: 06/18/2015 12:54   Dg Chest Port 1 View  06/18/2015  CLINICAL DATA:  Confusion and cough EXAM: PORTABLE CHEST 1 VIEW COMPARISON:  03/02/2014 FINDINGS: Cardiac shadow is within normal limits. The right lung is clear. Increased infiltrate is noted in the left upper lobe. No sizable effusion is seen. No bony abnormality is  noted. IMPRESSION: Left upper lobe infiltrate. Followup PA and lateral chest X-ray is recommended in 3-4 weeks following trial of antibiotic therapy to ensure resolution and exclude underlying malignancy. Electronically Signed   By: Inez Catalina M.D.   On: 06/18/2015 14:25   I have personally reviewed and evaluated these images and lab results as part of my medical decision-making.   EKG Interpretation None      MDM   Final diagnoses:  Hepatic encephalopathy ([place])    Patient just started on 5/22 taking Regorafenib for his liver malignancy. His become encephalopathic. His left upper lobe pneumonia. His only symptom with this his cough. He is not febrile. He is not hypoxemic tachycardic or tachypneic. He is resting comfortably without any increased work of breathing. He will awaken and answer simple questioning. He knows he is in the hospital. He does not know the date. Have obtained blood cultures and given antibiotics. I discussed the case with Dr. troponin of [facility] hospitalist. Patient be admitted.    Tanna Furry, MD 06/18/15 1739

## 2015-06-18 NOTE — ED Notes (Signed)
Hospitalist at bedside 

## 2015-06-18 NOTE — ED Notes (Signed)
Per lab both blood cultures have been obtained.

## 2015-06-18 NOTE — ED Notes (Signed)
Pt reports pain and weakness in both legs for " a while."   Pt oriented to self and place but seems confused when answering some questions.  Pt's mouth dry.

## 2015-06-19 ENCOUNTER — Encounter (HOSPITAL_COMMUNITY): Payer: Self-pay | Admitting: Internal Medicine

## 2015-06-19 DIAGNOSIS — R52 Pain, unspecified: Secondary | ICD-10-CM

## 2015-06-19 DIAGNOSIS — J45909 Unspecified asthma, uncomplicated: Secondary | ICD-10-CM | POA: Diagnosis present

## 2015-06-19 DIAGNOSIS — Z86718 Personal history of other venous thrombosis and embolism: Secondary | ICD-10-CM | POA: Diagnosis not present

## 2015-06-19 DIAGNOSIS — M25461 Effusion, right knee: Secondary | ICD-10-CM | POA: Diagnosis present

## 2015-06-19 DIAGNOSIS — Z923 Personal history of irradiation: Secondary | ICD-10-CM | POA: Diagnosis not present

## 2015-06-19 DIAGNOSIS — I820 Budd-Chiari syndrome: Secondary | ICD-10-CM | POA: Diagnosis present

## 2015-06-19 DIAGNOSIS — D649 Anemia, unspecified: Secondary | ICD-10-CM | POA: Diagnosis present

## 2015-06-19 DIAGNOSIS — C220 Liver cell carcinoma: Secondary | ICD-10-CM | POA: Diagnosis not present

## 2015-06-19 DIAGNOSIS — E785 Hyperlipidemia, unspecified: Secondary | ICD-10-CM | POA: Diagnosis present

## 2015-06-19 DIAGNOSIS — F1010 Alcohol abuse, uncomplicated: Secondary | ICD-10-CM | POA: Diagnosis not present

## 2015-06-19 DIAGNOSIS — I10 Essential (primary) hypertension: Secondary | ICD-10-CM | POA: Diagnosis present

## 2015-06-19 DIAGNOSIS — K7200 Acute and subacute hepatic failure without coma: Secondary | ICD-10-CM | POA: Diagnosis present

## 2015-06-19 DIAGNOSIS — E871 Hypo-osmolality and hyponatremia: Secondary | ICD-10-CM | POA: Diagnosis present

## 2015-06-19 DIAGNOSIS — R040 Epistaxis: Secondary | ICD-10-CM | POA: Diagnosis present

## 2015-06-19 DIAGNOSIS — E7800 Pure hypercholesterolemia, unspecified: Secondary | ICD-10-CM | POA: Diagnosis present

## 2015-06-19 DIAGNOSIS — Z9221 Personal history of antineoplastic chemotherapy: Secondary | ICD-10-CM | POA: Diagnosis not present

## 2015-06-19 DIAGNOSIS — M25462 Effusion, left knee: Secondary | ICD-10-CM | POA: Diagnosis present

## 2015-06-19 DIAGNOSIS — Z85818 Personal history of malignant neoplasm of other sites of lip, oral cavity, and pharynx: Secondary | ICD-10-CM | POA: Diagnosis not present

## 2015-06-19 DIAGNOSIS — G9340 Encephalopathy, unspecified: Secondary | ICD-10-CM | POA: Diagnosis not present

## 2015-06-19 DIAGNOSIS — R4182 Altered mental status, unspecified: Secondary | ICD-10-CM | POA: Diagnosis present

## 2015-06-19 DIAGNOSIS — J189 Pneumonia, unspecified organism: Secondary | ICD-10-CM | POA: Diagnosis not present

## 2015-06-19 DIAGNOSIS — Z7901 Long term (current) use of anticoagulants: Secondary | ICD-10-CM | POA: Diagnosis not present

## 2015-06-19 DIAGNOSIS — C7951 Secondary malignant neoplasm of bone: Secondary | ICD-10-CM | POA: Diagnosis present

## 2015-06-19 DIAGNOSIS — K219 Gastro-esophageal reflux disease without esophagitis: Secondary | ICD-10-CM | POA: Diagnosis present

## 2015-06-19 DIAGNOSIS — K7290 Hepatic failure, unspecified without coma: Secondary | ICD-10-CM | POA: Diagnosis not present

## 2015-06-19 DIAGNOSIS — M25562 Pain in left knee: Secondary | ICD-10-CM | POA: Diagnosis present

## 2015-06-19 DIAGNOSIS — E440 Moderate protein-calorie malnutrition: Secondary | ICD-10-CM | POA: Diagnosis present

## 2015-06-19 DIAGNOSIS — Z87891 Personal history of nicotine dependence: Secondary | ICD-10-CM | POA: Diagnosis not present

## 2015-06-19 DIAGNOSIS — Y95 Nosocomial condition: Secondary | ICD-10-CM | POA: Diagnosis present

## 2015-06-19 LAB — LACTIC ACID, PLASMA: Lactic Acid, Venous: 2 mmol/L (ref 0.5–2.0)

## 2015-06-19 LAB — BASIC METABOLIC PANEL
Anion gap: 4 — ABNORMAL LOW (ref 5–15)
BUN: 7 mg/dL (ref 6–20)
CO2: 21 mmol/L — ABNORMAL LOW (ref 22–32)
CREATININE: 0.54 mg/dL — AB (ref 0.61–1.24)
Calcium: 7.8 mg/dL — ABNORMAL LOW (ref 8.9–10.3)
Chloride: 108 mmol/L (ref 101–111)
GFR calc Af Amer: >60 mL/min (ref >60)
Glucose, Bld: 98 mg/dL (ref 65–99)
POTASSIUM: 3.5 mmol/L (ref 3.5–5.1)
SODIUM: 133 mmol/L — AB (ref 135–145)

## 2015-06-19 LAB — AMMONIA: AMMONIA: 51 umol/L — AB (ref 9–35)

## 2015-06-19 MED ORDER — RIFAXIMIN 550 MG PO TABS
550.0000 mg | ORAL_TABLET | Freq: Two times a day (BID) | ORAL | Status: DC
  Administered 2015-06-19 – 2015-06-25 (×13): 550 mg via ORAL
  Filled 2015-06-19 (×14): qty 1

## 2015-06-19 MED ORDER — VANCOMYCIN HCL IN DEXTROSE 1-5 GM/200ML-% IV SOLN
1000.0000 mg | Freq: Two times a day (BID) | INTRAVENOUS | Status: DC
  Administered 2015-06-19 – 2015-06-25 (×12): 1000 mg via INTRAVENOUS
  Filled 2015-06-19 (×11): qty 200

## 2015-06-19 MED ORDER — IBUPROFEN 400 MG PO TABS
400.0000 mg | ORAL_TABLET | Freq: Four times a day (QID) | ORAL | Status: DC | PRN
  Administered 2015-06-19 – 2015-06-25 (×9): 400 mg via ORAL
  Filled 2015-06-19 (×10): qty 1

## 2015-06-19 MED ORDER — ENSURE ENLIVE PO LIQD
237.0000 mL | Freq: Three times a day (TID) | ORAL | Status: DC
  Administered 2015-06-19 – 2015-06-25 (×14): 237 mL via ORAL

## 2015-06-19 NOTE — Progress Notes (Signed)
Pharmacy Antibiotic Note  [name] is a 60 y.o. male admitted on 06/18/2015 with pneumonia.  Pharmacy has been consulted for Vancomycin and Cefepime (renal per MD) dosing.  Plan: Vancomycin 1gm IV q12hrs Check trough at steady state Monitor labs, micro and vitals.  Cefepime dose OK  Height: 5\' 8"  (172.7 cm) Weight: 168 lb (76.204 kg) IBW/kg (Calculated) : 68.4  Temp (24hrs), Avg:97.6 F (36.4 C), Min:97.5 F (36.4 C), Max:97.7 F (36.5 C)   Recent Labs Lab 06/18/15 1319 06/18/15 2058 06/18/15 2353 06/19/15 0649  WBC 6.6  --   --   --   CREATININE 0.66  --   --  0.54*  LATICACIDVEN  --  3.4* 2.0  --     Estimated Creatinine Clearance: 96.2 mL/min (by C-G formula based on Cr of 0.54).    No Known Allergies  Antimicrobials this admission: Vanc 5/31 >>  Cefepime 5/31 >>   Dose adjustments this admission: N/A  Microbiology results: 5/31 BCx: Pending MRSA PCR:   Thank you for allowing pharmacy to be a part of this patient's care.  Hart Robinsons A 06/19/2015 12:52 PM

## 2015-06-19 NOTE — Consult Note (Addendum)
Reason for Consult:hepatic encephalopathy   Referring Physician: fanta  [name] is an 60 y.o. male.  UTM:LYYTKPTW thru the ED with confusion. Brother apparently noticed he was confused. Ammonia in ED 117.Hx of alcoholic liver disease. Hx of metastatic liver cancer and is followed at [facility]. Takes regorafenib 110m daily x 21 days of a 28 day cycle. [facility]  He tell me he feels better today. He seem alert.  He lives by himself. His brother takes him to his OV. He appears to have lost weight.    Today his ammonia is 51. Hx of tonsillar cancer in 2009. Followed by [name]   No tattoos. NO IV drug use Ad[facility] in March to [facility] for rectal bleeding. Colitis. GI pathogen was positive for E coli. Covered with Cipro and Flagyl.  CT scan of head negative this admission. CBC    Component Value Date/Time   WBC 6.6 06/18/2015 1319   RBC 4.33 06/18/2015 1319   RBC 3.37* 07/26/2007 0435   HGB 11.3* 06/18/2015 1319   HCT 34.4* 06/18/2015 1319   PLT 207 06/18/2015 1319   MCV 79.4 06/18/2015 1319   MCH 26.1 06/18/2015 1319   MCHC 32.8 06/18/2015 1319   RDW 20.2* 06/18/2015 1319   LYMPHSABS 0.9 06/18/2015 1319   MONOABS 0.7 06/18/2015 1319   EOSABS 0.1 06/18/2015 1319   BASOSABS 0.0 06/18/2015 1319    Hepatic Function Panel     Component Value Date/Time   PROT 8.1 06/18/2015 1319   ALBUMIN 2.0* 06/18/2015 1319   AST 158* 06/18/2015 1319   ALT 64* 06/18/2015 1319   ALKPHOS 242* 06/18/2015 1319   BILITOT 2.9* 06/18/2015 1319   BILIDIR 1.2* 06/18/2015 1319   IBILI 1.7* 06/18/2015 1319        Past Medical History  Diagnosis Date  . Asthma   . [place]   . Hypertension   . Hyperlipidemia   . 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 Tonsil cancer ([place]   . Liver cancer ([facility]     Past Surgical History  Procedure Laterality Date  . Cholecystectomy    . Surgery to remove cancer from neck      6 yrs ago  . Hepatic artery  embolization  07/18/14    [facility]    Family History  Problem Relation Age of Onset  . Family history unknown: Yes    Social History:  reports that he quit smoking about 8 years ago. His smoking use included Cigarettes. He has a 3.5 pack-year smoking history. He has never used smokeless tobacco. He reports that he drinks alcohol. He reports that he does not use illicit drugs.  Allergies: No Known Allergies  Medications: I have reviewed the patient's current medications.  Results for orders placed or performed during the hospital encounter of 06/18/15 (from the past 48 hour(s))  CBC with Differential     Status: Abnormal   Collection Time: 06/18/15  1:19 PM  Result Value Ref Range   WBC 6.6 4.0 - 10.5 K/uL   RBC 4.33 4.22 - 5.81 MIL/uL   Hemoglobin 11.3 (L) 13.0 - 17.0 g/dL   HCT 34.4 (L) 39.0 - 52.0 %   MCV 79.4 78.0 - 100.0 fL   MCH 26.1 26.0 - 34.0 pg   MCHC 32.8 30.0 - 36.0 g/dL   RDW 20.2 (H) 11.5 - 15.5 %   Platelets 207 150 - 400 K/uL    Comment: SPECIMEN CHECKED  FOR CLOTS PLATELET COUNT CONFIRMED BY SMEAR    Neutrophils Relative % 76 %   Neutro Abs 5.0 1.7 - 7.7 K/uL   Lymphocytes Relative 13 %   Lymphs Abs 0.9 0.7 - 4.0 K/uL   Monocytes Relative 10 %   Monocytes Absolute 0.7 0.1 - 1.0 K/uL   Eosinophils Relative 1 %   Eosinophils Absolute 0.1 0.0 - 0.7 K/uL   Basophils Relative 1 %   Basophils Absolute 0.0 0.0 - 0.1 K/uL  Basic metabolic panel     Status: Abnormal   Collection Time: 06/18/15  1:19 PM  Result Value Ref Range   Sodium 132 (L) 135 - 145 mmol/L   Potassium 4.0 3.5 - 5.1 mmol/L   Chloride 103 101 - 111 mmol/L   CO2 22 22 - 32 mmol/L   Glucose, Bld 68 65 - 99 mg/dL   BUN 8 6 - 20 mg/dL   Creatinine, Ser 0.66 0.61 - 1.24 mg/dL   Calcium 8.2 (L) 8.9 - 10.3 mg/dL   GFR calc non Af Amer >60 >60 mL/min   GFR calc Af Amer >60 >60 mL/min    Comment: (NOTE) The eGFR has been calculated using the CKD EPI equation. This calculation has not been validated in  all clinical situations. eGFR's persistently <60 mL/min signify possible Chronic Kidney Disease.    Anion gap 7 5 - 15  Hepatic function panel     Status: Abnormal   Collection Time: 06/18/15  1:19 PM  Result Value Ref Range   Total Protein 8.1 6.5 - 8.1 g/dL   Albumin 2.0 (L) 3.5 - 5.0 g/dL   AST 158 (H) 15 - 41 U/L   ALT 64 (H) 17 - 63 U/L   Alkaline Phosphatase 242 (H) 38 - 126 U/L   Total Bilirubin 2.9 (H) 0.3 - 1.2 mg/dL   Bilirubin, Direct 1.2 (H) 0.1 - 0.5 mg/dL   Indirect Bilirubin 1.7 (H) 0.3 - 0.9 mg/dL  Procalcitonin - Baseline     Status: None   Collection Time: 06/18/15  1:19 PM  Result Value Ref Range   Procalcitonin 0.13 ng/mL    Comment:        Interpretation: PCT (Procalcitonin) <= 0.5 ng/mL: Systemic infection (sepsis) is not likely. Local bacterial infection is possible. (NOTE)         ICU PCT Algorithm               Non ICU PCT Algorithm    ----------------------------     ------------------------------         PCT < 0.25 ng/mL                 PCT < 0.1 ng/mL     Stopping of antibiotics            Stopping of antibiotics       strongly encouraged.               strongly encouraged.    ----------------------------     ------------------------------       PCT level decrease by               PCT < 0.25 ng/mL       >= 80% from peak PCT       OR PCT 0.25 - 0.5 ng/mL          Stopping of antibiotics  encouraged.     Stopping of antibiotics           encouraged.    ----------------------------     ------------------------------       PCT level decrease by              PCT >= 0.25 ng/mL       < 80% from peak PCT        AND PCT >= 0.5 ng/mL            Continuin g antibiotics                                              encouraged.       Continuing antibiotics            encouraged.    ----------------------------     ------------------------------     PCT level increase compared          PCT > 0.5 ng/mL         with  peak PCT AND          PCT >= 0.5 ng/mL             Escalation of antibiotics                                          strongly encouraged.      Escalation of antibiotics        strongly encouraged.   Ammonia     Status: Abnormal   Collection Time: 06/18/15  2:13 PM  Result Value Ref Range   Ammonia 117 (H) 9 - 35 umol/L  Protime-INR     Status: Abnormal   Collection Time: 06/18/15  2:13 PM  Result Value Ref Range   Prothrombin Time 17.0 (H) 11.6 - 15.2 seconds   INR 1.37 0.00 - 1.49  Ethanol     Status: None   Collection Time: 06/18/15  2:13 PM  Result Value Ref Range   Alcohol, Ethyl (B) <5 <5 mg/dL    Comment:        LOWEST DETECTABLE LIMIT FOR SERUM ALCOHOL IS 5 mg/dL FOR MEDICAL PURPOSES ONLY   Urinalysis, Routine w reflex microscopic (not at [facility])     Status: Abnormal   Collection Time: 06/18/15  3:22 PM  Result Value Ref Range   Color, Urine YELLOW YELLOW   APPearance CLEAR CLEAR   Specific Gravity, Urine 1.020 1.005 - 1.030   pH 6.5 5.0 - 8.0   Glucose, UA 100 (A) NEGATIVE mg/dL   Hgb urine dipstick NEGATIVE NEGATIVE   Bilirubin Urine NEGATIVE NEGATIVE   Ketones, ur TRACE (A) NEGATIVE mg/dL   Protein, ur NEGATIVE NEGATIVE mg/dL   Nitrite NEGATIVE NEGATIVE   Leukocytes, UA NEGATIVE NEGATIVE    Comment: MICROSCOPIC NOT DONE ON URINES WITH NEGATIVE PROTEIN, BLOOD, LEUKOCYTES, NITRITE, OR GLUCOSE <1000 mg/dL.  Urine rapid drug screen (hosp performed)     Status: None   Collection Time: 06/18/15  3:22 PM  Result Value Ref Range   Opiates NONE DETECTED NONE DETECTED   Cocaine NONE DETECTED NONE DETECTED   Benzodiazepines NONE DETECTED NONE DETECTED   Amphetamines NONE DETECTED NONE DETECTED   Tetrahydrocannabinol NONE DETECTED NONE DETECTED   Barbiturates NONE DETECTED NONE  DETECTED    Comment:        DRUG SCREEN FOR MEDICAL PURPOSES ONLY.  IF CONFIRMATION IS NEEDED FOR ANY PURPOSE, NOTIFY LAB WITHIN 5 DAYS.        LOWEST DETECTABLE LIMITS FOR URINE DRUG  SCREEN Drug Class       Cutoff (ng/mL) Amphetamine      1000 Barbiturate      200 Benzodiazepine   161 Tricyclics       096 Opiates          300 Cocaine          300 THC              50   Culture, blood (Routine X 2) w Reflex to ID Panel     Status: None (Preliminary result)   Collection Time: 06/18/15  5:10 PM  Result Value Ref Range   Specimen Description BLOOD RIGHT HAND    Special Requests BOTTLES DRAWN AEROBIC ONLY 4CC    Culture NO GROWTH < 24 HOURS    Report Status PENDING   Culture, blood (Routine X 2) w Reflex to ID Panel     Status: None (Preliminary result)   Collection Time: 06/18/15  5:17 PM  Result Value Ref Range   Specimen Description BLOOD LEFT HAND    Special Requests BOTTLES DRAWN AEROBIC ONLY 5CC    Culture NO GROWTH < 24 HOURS    Report Status PENDING   Lactic acid, plasma     Status: Abnormal   Collection Time: 06/18/15  8:58 PM  Result Value Ref Range   Lactic Acid, Venous 3.4 (HH) 0.5 - 2.0 mmol/L    Comment: CRITICAL RESULT CALLED TO, READ BACK BY AND VERIFIED WITH: HERN,J AT 2140 ON 06/18/2015 BY ISLEY,B   Lactic acid, plasma     Status: None   Collection Time: 06/18/15 11:53 PM  Result Value Ref Range   Lactic Acid, Venous 2.0 0.5 - 2.0 mmol/L  Ammonia     Status: Abnormal   Collection Time: 06/19/15  6:49 AM  Result Value Ref Range   Ammonia 51 (H) 9 - 35 umol/L  Basic metabolic panel     Status: Abnormal   Collection Time: 06/19/15  6:49 AM  Result Value Ref Range   Sodium 133 (L) 135 - 145 mmol/L   Potassium 3.5 3.5 - 5.1 mmol/L   Chloride 108 101 - 111 mmol/L   CO2 21 (L) 22 - 32 mmol/L   Glucose, Bld 98 65 - 99 mg/dL   BUN 7 6 - 20 mg/dL   Creatinine, Ser 0.54 (L) 0.61 - 1.24 mg/dL   Calcium 7.8 (L) 8.9 - 10.3 mg/dL   GFR calc non Af Amer >60 >60 mL/min   GFR calc Af Amer >60 >60 mL/min    Comment: (NOTE) The eGFR has been calculated using the CKD EPI equation. This calculation has not been validated in all clinical  situations. eGFR's persistently <60 mL/min signify possible Chronic Kidney Disease.    Anion gap 4 (L) 5 - 15    Ct Head Wo Contrast  06/18/2015  CLINICAL DATA:  Recent onset confusion/altered mental status. History of tonsil carcinoma EXAM: CT HEAD WITHOUT CONTRAST TECHNIQUE: Contiguous axial images were obtained from the base of the skull through the vertex without intravenous contrast. COMPARISON:  None. FINDINGS: The ventricles are normal in size and configuration. The left lateral ventricle is slightly larger than the right lateral ventricle, an anatomic variant. There is no  intracranial mass, hemorrhage, extra-axial fluid collection, or midline shift. Gray-white compartments appear normal. No acute infarct evident. Bony calvarium appears intact. The mastoid air cells are clear. No intraorbital lesions are evident. IMPRESSION: Study within normal limits. Electronically Signed   By: Lowella Grip III M.D.   On: 06/18/2015 12:54   Dg Chest Port 1 View  06/18/2015  CLINICAL DATA:  Confusion and cough EXAM: PORTABLE CHEST 1 VIEW COMPARISON:  03/02/2014 FINDINGS: Cardiac shadow is within normal limits. The right lung is clear. Increased infiltrate is noted in the left upper lobe. No sizable effusion is seen. No bony abnormality is noted. IMPRESSION: Left upper lobe infiltrate. Followup PA and lateral chest X-ray is recommended in 3-4 weeks following trial of antibiotic therapy to ensure resolution and exclude underlying malignancy. Electronically Signed   By: Inez Catalina M.D.   On: 06/18/2015 14:25    ROS Blood pressure 125/70, pulse 73, temperature 97.5 F (36.4 C), temperature source Oral, resp. rate 20, height '5\' 8"'  (1.727 m), weight 168 lb (76.204 kg), SpO2 100 %. Physical Exam Alert and oriented. Skin warm and dry. Oral mucosa is moist.   . Sclera anicteric, conjunctivae is pink. Thyroid not enlarged. No cervical lymphadenopathy. Lungs clear. Heart regular rate and rhythm.  Abdomen is  soft. Bowel sounds are positive. No hepatomegaly. No abdominal masses felt. No tenderness.Abdomen appears distended.  No edema to lower extremities.   Assessment/Plan: Hepatic encephalopathy. Ammonia has come down. Continue the Lactulose. Xifaxan 527m BID  [name],TERRI W 06/19/2015, 11:01 AM   GI attending note: Late entry; patient was seen yesterday afternoon> Patient is 60year old African-American male who was [place] [facility] of hepatic encephalopathy and admitted with confusion and elevated serum ammonia. On admission no evidence of active GI bleed or dehydration. Patient feels much better. He still has asterixis. Exam also pertinent for hepatomegaly. Hepatic encephalopathy appears to be spontaneous due to advanced liver disease. Other possible etiology would be pneumonia but he does not have typical symptoms. Patient begun on Xifaxan. Will ask social service team to assess situation to make sure that he has adequate help at home.

## 2015-06-19 NOTE — Evaluation (Signed)
Physical Therapy Evaluation Patient Details Name: [name] MRN: [id number] DOB: [birth year] Today's Date: 06/19/2015   History of Present Illness  60 yo M admitted with Generalized weakness and confusion. Acute encephalopathy - likely hepatic due to elevated ammonia levels, ?HCAP with LUL infiltrate on CXR concerning for PNA, treated as HCAP given recent hospital admission.  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  Prior to admission pt lived alone, and was ambulating at Mod (I) level with cane or RW, he was independent with dressing and bathing.  Today, during PT evaluation he demonstrated modified independence with bed mobility, Min guard for transfer sit<>stand, and Min guard for ambulation x59ft with RW.  Pt was very lethargic today, and was c/o increased pain/burning in B LE's.  RN notified of pt's pain level of 7/10.  At this point, he is recommended for SNF due to level of assistance needed for safe mobilization, as well as limited caregivers at home.  If he is able to have 24/7 supervision and assistance, then he would be recommended for Home with HHPT.  However, he was unable to give me a name of a person who would be able to assist him with that at this time.     Follow Up Recommendations SNF    Equipment Recommendations       Recommendations for Other Services       Precautions / Restrictions Restrictions Weight Bearing Restrictions: No      Mobility  Bed Mobility Overal bed mobility: Modified Independent                Transfers Overall transfer level: Needs assistance Equipment used: Rolling walker (2 wheeled) Transfers: Sit to/from Stand Sit to Stand: Min guard          General transfer comment: vc's for hand placement  Ambulation/Gait Ambulation/Gait assistance: Min guard Ambulation Distance (Feet): 20 Feet Assistive device: Rolling walker (2 wheeled) Gait Pattern/deviations: Step-to pattern;Trunk flexed     General Gait Details: Pt limited due to B LE pain, as well as fatigue.   Stairs            Wheelchair Mobility    Modified Rankin (Stroke Patients Only)       Balance Overall balance assessment: Needs assistance         Standing balance support: Bilateral upper extremity supported Standing balance-Leahy Scale: Fair                               Pertinent Vitals/Pain Pain Assessment: 0-10 Pain Score: 7  Pain Location: B LE's Pain Descriptors / Indicators: Burning Pain Intervention(s): Repositioned;Limited activity within patient's tolerance;Other (comment) (RN notified of pt's elevated pain level.  )    Home Living   Living Arrangements: Alone   Type of Home: House Home Access: Stairs to enter   Entrance Stairs-Number of Steps: 5 Home Layout: One level Home Equipment: [place] - single point;Walker - 2 wheels      Prior Function Level of Independence: Independent with assistive device(s)   Gait / Transfers Assistance Needed: Pt used either a RW or a cane for ambulation.  ADL's / Homemaking Assistance Needed: Pt reports he was indpendent for dressing and bathing.  This was confirmed by the niece.         Hand Dominance        Extremity/Trunk Assessment   Upper Extremity Assessment: Generalized weakness           Lower Extremity Assessment: Generalized weakness         Communication   Communication: No difficulties  Cognition Arousal/Alertness: Lethargic Behavior During Therapy: Flat affect Overall Cognitive Status: Impaired/Different from baseline Area of Impairment: Orientation Orientation Level: Disoriented to;Time;Situation   Memory: Decreased short-term memory               General Comments      Exercises        Assessment/Plan    PT Assessment Patient needs continued PT services  PT Diagnosis Difficulty walking;Abnormality of gait;Generalized weakness   PT Problem List Decreased strength;Decreased activity tolerance;Decreased balance;Decreased mobility;Decreased coordination;Decreased cognition;Decreased knowledge of use of DME;Decreased safety awareness;Decreased knowledge of precautions;Cardiopulmonary status limiting activity;Pain  PT Treatment Interventions DME instruction;Gait training;Stair training;Functional mobility training;Therapeutic activities;Therapeutic exercise;Balance training;Patient/family education   PT Goals (Current goals can be found in the Care Plan section) Acute Rehab PT Goals PT Goal Formulation: Patient unable to participate in goal setting Time For Goal Achievement: 07/03/15 Potential to Achieve Goals: Fair    Frequency Min 4X/week   Barriers to discharge Decreased caregiver support      Co-evaluation               End of Session Equipment Utilized During Treatment: Gait belt Activity Tolerance: Patient limited by fatigue Patient left: in chair;with call bell/phone within reach;with family/visitor present      Functional Assessment Tool Used: [employer] "6-clicks"  Functional Limitation: Mobility: Walking and moving around Mobility: Walking and Moving Around Current Status [phone]): At least 40 percent but less than 60 percent impaired, limited or restricted Mobility: Walking and Moving Around Goal Status [phone]): At least 20 percent but less than 40 percent impaired, limited or restricted    Time: 1007-1030 PT Time Calculation (min) (ACUTE ONLY): 23 min   Charges:   PT Evaluation $PT Eval Low Complexity: 1 Procedure PT Treatments $Gait Training: 8-22 mins   PT G Codes:   PT G-Codes **NOT FOR INPATIENT CLASS** Functional Assessment Tool Used: [employer] "6-clicks"   Functional Limitation: Mobility: Walking and moving around Mobility: Walking and Moving Around Current Status [phone]): At least 40 percent but less than 60 percent impaired, limited or restricted Mobility: Walking and Moving Around Goal Status [phone]): At least 20 percent but less than 40 percent impaired, limited or restricted    Tacy Learn, PT, DPT X: [id number]   06/19/2015, 11:56 AM

## 2015-06-19 NOTE — Care Management Obs Status (Signed)
[place] NOTIFICATION   Patient Details  Name: [name] MRN: [id number] Date of Birth: [birth year]   Medicare Observation Status Notification Given:  Yes    [name], RN 06/19/2015, 8:33 AM

## 2015-06-19 NOTE — Progress Notes (Signed)
Initial Nutrition Assessment  DOCUMENTATION CODES:  Non-severe (moderate) malnutrition in context of chronic illness   Pt meets criteria for Moderate MALNUTRITION in the context of Chronic Illness as evidenced by Loss of >10% bw in 4 months and mild-mod muscle/fat wasting.  INTERVENTION:  Ensure Enlive po TID, each supplement provides 350 kcal and 20 grams of protein  NUTRITION DIAGNOSIS:  Increased nutrient needs related to cancer and cancer related treatments + acute illness as evidenced by estimated nutritional requirements for these conditions  GOAL:  Patient will meet greater than or equal to 90% of their needs  MONITOR:  PO intake, Supplement acceptance, Labs  REASON FOR ASSESSMENT:  Malnutrition Screening Tool    ASSESSMENT:  60 y/o Male PMHx Metastatic Liver cancer currently undergoing treatment, HTN, ETOH abuse, HLD. Presented with acute encephalopathy, likely of hepatic origin. Also found to have PNA.   Pt was still very encephalopathic/confused on RD arrival. He was unable to provide any information about oral intake/wt PTA. He appears to have ben able to eat ~50% of his lunch today, 40% breakfast. He had drank some ensure.   Per EMR review, pt has lost >10% since beginning of February. This is clinically significant.   Will increase Ensure to TID given very high Kcal/Pro requirements.   NFPE: No lower body wasting appreciated. Mild upper body fat loss and moderate muscle loss noted, most prominent in face.   Labs reviewed: NH3: 51,  Albumin: 2.0  Recent Labs Lab 06/18/15 1319 06/19/15 0649  NA 132* 133*  K 4.0 3.5  CL 103 108  CO2 22 21*  BUN 8 7  CREATININE 0.66 0.54*  CALCIUM 8.2* 7.8*  GLUCOSE 68 98   Diet Order:  Diet Heart Room service appropriate?: Yes; Fluid consistency:: Thin  Skin:  Reviewed, no issues  Last BM:  Unknown  Height:  Ht Readings from Last 1 Encounters:  06/18/15 5\' 8"  (1.727 m)   Weight:  Wt Readings from Last 1  Encounters:  06/19/15 168 lb (76.204 kg)   Wt Readings from Last 10 Encounters:  06/19/15 168 lb (76.204 kg)  04/18/15 179 lb 11.2 oz (81.511 kg)  02/21/15 190 lb (86.183 kg)  09/26/14 197 lb 11.2 oz (89.676 kg)  08/13/14 204 lb (92.534 kg)  03/26/14 180 lb (81.647 kg)  03/12/14 181 lb 14.4 oz (82.509 kg)  03/02/14 195 lb (88.451 kg)  07/24/13 178 lb (80.74 kg)  04/01/13 173 lb (78.472 kg)   Ideal Body Weight:  70 kg  BMI:  Body mass index is 25.55 kg/(m^2).  Estimated Nutritional Needs:  Kcal:  2050-2300 (27-30 kcal/kg bw) Protein:  91-106 g (1.2-1.4 g/kg BW) Fluid:  > 2.1 liters  EDUCATION NEEDS:  No education needs identified at this time  Burtis Junes RD, LDN, [facility] Pager: [phone] 06/19/2015 5:11 PM

## 2015-06-19 NOTE — Progress Notes (Signed)
Subjective: This is a 60 years old male with history of multiple medical illnesses including hepatocellular carcinoma was admitted due to confusion and change in his mental status. His ammonia level is elevated. Patient also has infiltrate on his chest x-ray. He is started on IV antibiotics and lactulose.  Objective: Vital signs in last 24 hours: Temp:  [97.5 F (36.4 C)-98.5 F (36.9 C)] 97.5 F (36.4 C) (06/01 0542) Pulse Rate:  [70-91] 73 (06/01 0542) Resp:  [13-27] 20 (06/01 0542) BP: (117-149)/(69-88) 125/70 mmHg (06/01 0542) SpO2:  [98 %-100 %] 100 % (06/01 0542) Weight:  [76.204 kg (168 lb)-81.194 kg (179 lb)] 76.204 kg (168 lb) (06/01 0001) Weight change:     Intake/Output from previous day:    PHYSICAL EXAM General appearance: no distress and slowed mentation Resp: diminished breath sounds bilaterally and rhonchi bilaterally Cardio: S1, S2 normal GI: full abdomen, soft and lax, bowel sound is ++ Extremities: extremities normal, atraumatic, no cyanosis or edema  Lab Results:  Results for orders placed or performed during the hospital encounter of 06/18/15 (from the past 48 hour(s))  CBC with Differential     Status: Abnormal   Collection Time: 06/18/15  1:19 PM  Result Value Ref Range   WBC 6.6 4.0 - 10.5 K/uL   RBC 4.33 4.22 - 5.81 MIL/uL   Hemoglobin 11.3 (L) 13.0 - 17.0 g/dL   HCT 34.4 (L) 39.0 - 52.0 %   MCV 79.4 78.0 - 100.0 fL   MCH 26.1 26.0 - 34.0 pg   MCHC 32.8 30.0 - 36.0 g/dL   RDW 20.2 (H) 11.5 - 15.5 %   Platelets 207 150 - 400 K/uL    Comment: SPECIMEN CHECKED FOR CLOTS PLATELET COUNT CONFIRMED BY SMEAR    Neutrophils Relative % 76 %   Neutro Abs 5.0 1.7 - 7.7 K/uL   Lymphocytes Relative 13 %   Lymphs Abs 0.9 0.7 - 4.0 K/uL   Monocytes Relative 10 %   Monocytes Absolute 0.7 0.1 - 1.0 K/uL   Eosinophils Relative 1 %   Eosinophils Absolute 0.1 0.0 - 0.7 K/uL   Basophils Relative 1 %   Basophils Absolute 0.0 0.0 - 0.1 K/uL  Basic metabolic  panel     Status: Abnormal   Collection Time: 06/18/15  1:19 PM  Result Value Ref Range   Sodium 132 (L) 135 - 145 mmol/L   Potassium 4.0 3.5 - 5.1 mmol/L   Chloride 103 101 - 111 mmol/L   CO2 22 22 - 32 mmol/L   Glucose, Bld 68 65 - 99 mg/dL   BUN 8 6 - 20 mg/dL   Creatinine, Ser 0.66 0.61 - 1.24 mg/dL   Calcium 8.2 (L) 8.9 - 10.3 mg/dL   GFR calc non Af Amer >60 >60 mL/min   GFR calc Af Amer >60 >60 mL/min    Comment: (NOTE) The eGFR has been calculated using the CKD EPI equation. This calculation has not been validated in all clinical situations. eGFR's persistently <60 mL/min signify possible Chronic Kidney Disease.    Anion gap 7 5 - 15  Hepatic function panel     Status: Abnormal   Collection Time: 06/18/15  1:19 PM  Result Value Ref Range   Total Protein 8.1 6.5 - 8.1 g/dL   Albumin 2.0 (L) 3.5 - 5.0 g/dL   AST 158 (H) 15 - 41 U/L   ALT 64 (H) 17 - 63 U/L   Alkaline Phosphatase 242 (H) 38 - 126 U/L  Total Bilirubin 2.9 (H) 0.3 - 1.2 mg/dL   Bilirubin, Direct 1.2 (H) 0.1 - 0.5 mg/dL   Indirect Bilirubin 1.7 (H) 0.3 - 0.9 mg/dL  Procalcitonin - Baseline     Status: None   Collection Time: 06/18/15  1:19 PM  Result Value Ref Range   Procalcitonin 0.13 ng/mL    Comment:        Interpretation: PCT (Procalcitonin) <= 0.5 ng/mL: Systemic infection (sepsis) is not likely. Local bacterial infection is possible. (NOTE)         ICU PCT Algorithm               Non ICU PCT Algorithm    ----------------------------     ------------------------------         PCT < 0.25 ng/mL                 PCT < 0.1 ng/mL     Stopping of antibiotics            Stopping of antibiotics       strongly encouraged.               strongly encouraged.    ----------------------------     ------------------------------       PCT level decrease by               PCT < 0.25 ng/mL       >= 80% from peak PCT       OR PCT 0.25 - 0.5 ng/mL          Stopping of antibiotics                                              encouraged.     Stopping of antibiotics           encouraged.    ----------------------------     ------------------------------       PCT level decrease by              PCT >= 0.25 ng/mL       < 80% from peak PCT        AND PCT >= 0.5 ng/mL            Continuin g antibiotics                                              encouraged.       Continuing antibiotics            encouraged.    ----------------------------     ------------------------------     PCT level increase compared          PCT > 0.5 ng/mL         with peak PCT AND          PCT >= 0.5 ng/mL             Escalation of antibiotics                                          strongly encouraged.      Escalation of antibiotics  strongly encouraged.   Ammonia     Status: Abnormal   Collection Time: 06/18/15  2:13 PM  Result Value Ref Range   Ammonia 117 (H) 9 - 35 umol/L  Protime-INR     Status: Abnormal   Collection Time: 06/18/15  2:13 PM  Result Value Ref Range   Prothrombin Time 17.0 (H) 11.6 - 15.2 seconds   INR 1.37 0.00 - 1.49  Ethanol     Status: None   Collection Time: 06/18/15  2:13 PM  Result Value Ref Range   Alcohol, Ethyl (B) <5 <5 mg/dL    Comment:        LOWEST DETECTABLE LIMIT FOR SERUM ALCOHOL IS 5 mg/dL FOR MEDICAL PURPOSES ONLY   Urinalysis, Routine w reflex microscopic (not at [facility])     Status: Abnormal   Collection Time: 06/18/15  3:22 PM  Result Value Ref Range   Color, Urine YELLOW YELLOW   APPearance CLEAR CLEAR   Specific Gravity, Urine 1.020 1.005 - 1.030   pH 6.5 5.0 - 8.0   Glucose, UA 100 (A) NEGATIVE mg/dL   Hgb urine dipstick NEGATIVE NEGATIVE   Bilirubin Urine NEGATIVE NEGATIVE   Ketones, ur TRACE (A) NEGATIVE mg/dL   Protein, ur NEGATIVE NEGATIVE mg/dL   Nitrite NEGATIVE NEGATIVE   Leukocytes, UA NEGATIVE NEGATIVE    Comment: MICROSCOPIC NOT DONE ON URINES WITH NEGATIVE PROTEIN, BLOOD, LEUKOCYTES, NITRITE, OR GLUCOSE <1000 mg/dL.  Urine rapid drug screen (hosp  performed)     Status: None   Collection Time: 06/18/15  3:22 PM  Result Value Ref Range   Opiates NONE DETECTED NONE DETECTED   Cocaine NONE DETECTED NONE DETECTED   Benzodiazepines NONE DETECTED NONE DETECTED   Amphetamines NONE DETECTED NONE DETECTED   Tetrahydrocannabinol NONE DETECTED NONE DETECTED   Barbiturates NONE DETECTED NONE DETECTED    Comment:        DRUG SCREEN FOR MEDICAL PURPOSES ONLY.  IF CONFIRMATION IS NEEDED FOR ANY PURPOSE, NOTIFY LAB WITHIN 5 DAYS.        LOWEST DETECTABLE LIMITS FOR URINE DRUG SCREEN Drug Class       Cutoff (ng/mL) Amphetamine      1000 Barbiturate      200 Benzodiazepine   527 Tricyclics       782 Opiates          300 Cocaine          300 THC              50   Lactic acid, plasma     Status: Abnormal   Collection Time: 06/18/15  8:58 PM  Result Value Ref Range   Lactic Acid, Venous 3.4 (HH) 0.5 - 2.0 mmol/L    Comment: CRITICAL RESULT CALLED TO, READ BACK BY AND VERIFIED WITH: HERN,J AT 2140 ON 06/18/2015 BY ISLEY,B   Lactic acid, plasma     Status: None   Collection Time: 06/18/15 11:53 PM  Result Value Ref Range   Lactic Acid, Venous 2.0 0.5 - 2.0 mmol/L  Ammonia     Status: Abnormal   Collection Time: 06/19/15  6:49 AM  Result Value Ref Range   Ammonia 51 (H) 9 - 35 umol/L  Basic metabolic panel     Status: Abnormal   Collection Time: 06/19/15  6:49 AM  Result Value Ref Range   Sodium 133 (L) 135 - 145 mmol/L   Potassium 3.5 3.5 - 5.1 mmol/L   Chloride 108 101 - 111 mmol/L  CO2 21 (L) 22 - 32 mmol/L   Glucose, Bld 98 65 - 99 mg/dL   BUN 7 6 - 20 mg/dL   Creatinine, Ser 0.54 (L) 0.61 - 1.24 mg/dL   Calcium 7.8 (L) 8.9 - 10.3 mg/dL   GFR calc non Af Amer >60 >60 mL/min   GFR calc Af Amer >60 >60 mL/min    Comment: (NOTE) The eGFR has been calculated using the CKD EPI equation. This calculation has not been validated in all clinical situations. eGFR's persistently <60 mL/min signify possible Chronic Kidney Disease.     Anion gap 4 (L) 5 - 15    ABGS No results for input(s): PHART, PO2ART, TCO2, HCO3 in the last 72 hours.  Invalid input(s): PCO2 CULTURES No results found for this or any previous visit (from the past 240 hour(s)). Studies/Results: Ct Head Wo Contrast  06/18/2015  CLINICAL DATA:  Recent onset confusion/altered mental status. History of tonsil carcinoma EXAM: CT HEAD WITHOUT CONTRAST TECHNIQUE: Contiguous axial images were obtained from the base of the skull through the vertex without intravenous contrast. COMPARISON:  None. FINDINGS: The ventricles are normal in size and configuration. The left lateral ventricle is slightly larger than the right lateral ventricle, an anatomic variant. There is no intracranial mass, hemorrhage, extra-axial fluid collection, or midline shift. Gray-white compartments appear normal. No acute infarct evident. Bony calvarium appears intact. The mastoid air cells are clear. No intraorbital lesions are evident. IMPRESSION: Study within normal limits. Electronically Signed   By: Lowella Grip III M.D.   On: 06/18/2015 12:54   Dg Chest Port 1 View  06/18/2015  CLINICAL DATA:  Confusion and cough EXAM: PORTABLE CHEST 1 VIEW COMPARISON:  03/02/2014 FINDINGS: Cardiac shadow is within normal limits. The right lung is clear. Increased infiltrate is noted in the left upper lobe. No sizable effusion is seen. No bony abnormality is noted. IMPRESSION: Left upper lobe infiltrate. Followup PA and lateral chest X-ray is recommended in 3-4 weeks following trial of antibiotic therapy to ensure resolution and exclude underlying malignancy. Electronically Signed   By: Inez Catalina M.D.   On: 06/18/2015 14:25    Medications: I have reviewed the patient's current medications.  Assesment:   Principal Problem:   HCAP (healthcare-associated pneumonia) Active Problems:   ETOH abuse   Essential hypertension   GERD (gastroesophageal reflux disease)   Cancer, hepatocellular ([place])    Hypertension   Acute encephalopathy   Hepatic encephalopathy ([place])    Plan:  Mediations reviewed Will continue iv antibiotics Will do GI consult Will monitor ammonia level    LOS: 1 day   [name] 06/19/2015, 8:31 AM

## 2015-06-20 DIAGNOSIS — F1010 Alcohol abuse, uncomplicated: Secondary | ICD-10-CM

## 2015-06-20 DIAGNOSIS — E440 Moderate protein-calorie malnutrition: Secondary | ICD-10-CM | POA: Diagnosis present

## 2015-06-20 DIAGNOSIS — R52 Pain, unspecified: Secondary | ICD-10-CM

## 2015-06-20 DIAGNOSIS — K7290 Hepatic failure, unspecified without coma: Secondary | ICD-10-CM

## 2015-06-20 DIAGNOSIS — J189 Pneumonia, unspecified organism: Secondary | ICD-10-CM

## 2015-06-20 LAB — BASIC METABOLIC PANEL
ANION GAP: 4 — AB (ref 5–15)
BUN: 7 mg/dL (ref 6–20)
CHLORIDE: 107 mmol/L (ref 101–111)
CO2: 24 mmol/L (ref 22–32)
Calcium: 7.7 mg/dL — ABNORMAL LOW (ref 8.9–10.3)
Creatinine, Ser: 0.59 mg/dL — ABNORMAL LOW (ref 0.61–1.24)
GFR calc non Af Amer: >60 mL/min (ref >60)
GLUCOSE: 112 mg/dL — AB (ref 65–99)
POTASSIUM: 3.2 mmol/L — AB (ref 3.5–5.1)
Sodium: 135 mmol/L (ref 135–145)

## 2015-06-20 LAB — AMMONIA: Ammonia: 38 umol/L — ABNORMAL HIGH (ref 9–35)

## 2015-06-20 LAB — PROCALCITONIN: Procalcitonin: 0.1 ng/mL

## 2015-06-20 LAB — HIV ANTIBODY (ROUTINE TESTING W REFLEX): HIV Screen 4th Generation wRfx: NONREACTIVE

## 2015-06-20 LAB — STREP PNEUMONIAE URINARY ANTIGEN: Strep Pneumo Urinary Antigen: NEGATIVE

## 2015-06-20 MED ORDER — POTASSIUM CHLORIDE CRYS ER 20 MEQ PO TBCR
40.0000 meq | EXTENDED_RELEASE_TABLET | Freq: Two times a day (BID) | ORAL | Status: AC
  Administered 2015-06-20 (×2): 40 meq via ORAL
  Filled 2015-06-20 (×2): qty 2

## 2015-06-20 MED ORDER — REGORAFENIB 40 MG PO TABS
120.0000 mg | ORAL_TABLET | Freq: Every day | ORAL | Status: DC
  Administered 2015-06-22 – 2015-06-24 (×3): 120 mg via ORAL
  Filled 2015-06-20 (×5): qty 3

## 2015-06-20 NOTE — Clinical Social Work Note (Signed)
Clinical Social Work Assessment  Patient Details  Name: [name] MRN: [id number] Date of Birth: [birth year]  Date of referral:  06/20/15               Reason for consult:  Discharge Planning                Permission sought to share information with:  Family Supports Permission granted to share information::  Yes, Verbal Permission Granted  Name::     Education officer, community::     Relationship::  brother  Contact Information:     Housing/Transportation Living arrangements for the past 2 months:  Single Family Home Source of Information:  Patient Patient Interpreter Needed:  None Criminal Activity/Legal Involvement Pertinent to Current Situation/Hospitalization:  No - Comment as needed Significant Relationships:  Siblings, Significant Other Lives with:  Self Do you feel safe going back to the place where you live?  Yes Need for family participation in patient care:  Yes (Comment)  Care giving concerns:  Pt is alone some at home.    Social Worker assessment / plan:  CSW met with pt and pt's brother, Jori Moll at bedside. Pt alert and oriented x3 and reports he lives alone, but spends a lot of time at his girlfriend's house. He has 4 brothers who are involved. At baseline, pt is fairly independent and ambulates with a cane or walker. He still drives. Pt indicates he is feeling much better than yesterday and feels that he is close to baseline now. Yesterday, pt was very lethargic and SNF was a possibility. CSW discussed placement process and he refuses rehab. He states that he can stay with his girlfriend if needed. Pt is open to home health services. CM notified. CSW will sign off, but can be reconsulted if needed.   Employment status:  Disabled (Comment on whether or not currently receiving Disability) Insurance information:  Medicare PT Recommendations:  [place] / Referral to community resources:  [place]  Patient/Family's Response to care:   Pt requests to return home when medically stable.   Patient/Family's Understanding of and Emotional Response to Diagnosis, Current Treatment, and Prognosis:  Pt appears to understand admission diagnosis and is aware that d/c date is unknown at this time.   Emotional Assessment Appearance:  Appears stated age Attitude/Demeanor/Rapport:  Other (Cooperative) Affect (typically observed):  Appropriate Orientation:  Oriented to Self, Oriented to Place, Oriented to Situation Alcohol / Substance use:  Not Applicable Psych involvement (Current and /or in the community):  No (Comment)  Discharge Needs  Concerns to be addressed:  Discharge Planning Concerns Readmission within the last 30 days:  No Current discharge risk:  Physical Impairment Barriers to Discharge:  Continued Medical Work up   [employer], [employer], [place] 06/20/2015, 1:32 PM [phone]

## 2015-06-20 NOTE — Care Management Important Message (Signed)
Important Message  Patient Details  Name: [name] MRN: [id number] Date of Birth: November 22, 1955   Medicare Important Message Given:  Yes    [name], RN 06/20/2015, 9:36 AM

## 2015-06-20 NOTE — Care Management Note (Signed)
Case Management Note  Patient Details  Name: LATRAIL QAMAR MRN: [id number] Date of Birth: 20-Apr-1955  Subjective/Objective:      Spoke with patient and brothers Iona Beard and Jori Moll. Patient is from home alone but stays with girlfriend occasionally. Patient stated that he has 2 walkers and a cane at home. Drives occasionally according to brothers.  PT has recommended SNF but patient does not want to go. Patient agrees to [facility]. Patient and family have no preference of [facility] agency. Referral placed with [facility] in preparation for discharge.           Action/Plan: Home with Home Health does not want SNF   Expected Discharge Date:                  Expected Discharge Plan:  [place]  In-House Referral:  Clinical Social Work  Discharge planning Services  CM Consult  Post Acute Care Choice:    Choice offered to:  Sibling  DME Arranged:  N/A DME Agency:     HH Arranged:  RN, PT [place] Agency:  [place]  Status of Service:     Medicare Important Message Given:  Yes Date Medicare IM Given:    Medicare IM give by:    Date Additional Medicare IM Given:    Additional Medicare Important Message give by:     If discussed at [place] of Stay Meetings, dates discussed:    Additional Comments:  [name], RN 06/20/2015, 2:13 PM

## 2015-06-20 NOTE — Progress Notes (Signed)
  Subjective:  Ration complains of left knee pain. He denies recent fall or injury. He states his appetite is fair. He had 2-3 bowel movements yesterday and he has already had 2 today. He denies melena or rectal bleeding. He has cough but denies chest pain or shortness of breath. He does not recall having had fever prior to admission.  Objective: Blood pressure 117/76, pulse 93, temperature 98.4 F (36.9 C), temperature source Oral, resp. rate 18, height 5\' 8"  (1.727 m), weight 168 lb (76.204 kg), SpO2 100 %. Patient is alert and in no acute distress. Asterixis absent. He has generalized wasting. No neck adenopathy. Lungs are clear to auscultation. Abdomen is symmetrical and soft. Liver edge is 4-5 cm below RCM with indistinct margin. It is not tender. Small bilateral knee effusion noted. No LE edema or clubbing noted.  Labs/studies Results:   Recent Labs  06/18/15 1319  WBC 6.6  HGB 11.3*  HCT 34.4*  PLT 207    BMET   Recent Labs  06/18/15 1319 06/19/15 0649 06/20/15 0529  NA 132* 133* 135  K 4.0 3.5 3.2*  CL 103 108 107  CO2 22 21* 24  GLUCOSE 68 98 112*  BUN 8 7 7   CREATININE 0.66 0.54* 0.59*  CALCIUM 8.2* 7.8* 7.7*    LFT   Recent Labs  06/18/15 1319  PROT 8.1  ALBUMIN 2.0*  AST 158*  ALT 64*  ALKPHOS 242*  BILITOT 2.9*  BILIDIR 1.2*  IBILI 1.7*    PT/INR   Recent Labs  06/18/15 1413  LABPROT 17.0*  INR 1.37    Serum ammonia is 38; it was 51 yesterday and 117 2 days ago.  Assessment:  #1. Hepatic encephalopathy in a patient with advanced HCC. He is doing much better with combination of lactulose and Xifaxan. He will need to continue both of these medications when he is discharged. Patient has left upper lobe infiltrate but he is not febrile and does not appear to be toxic. Presuming it is an infection it could explain hepatic encephalopathy. #2. hepatocellular carcinoma. He received TARE at [facility] and now is on Regoranefib which she  is getting via [facility]. He needs to be back on his medication ASAP. #3. Bilateral knee pain. He has small bilateral pleural effusions. Further workup per Dr. Legrand Rams. #4. Left upper lobe infiltrate. Patient is on cefepime and vancomycin.   Recommendations:  Begin Regoranefib at a dose of 120 mg by mouth daily as an is patient's brother able to bring this medication the hospital. Continue lactulose and Xifaxan at current dose.

## 2015-06-20 NOTE — Progress Notes (Signed)
Subjective: Patient feels better today. He is more alert and awake. His ammonia level continue to decrease. He is complaining pain in his legs .   Objective: Vital signs in last 24 hours: Temp:  [97.8 F (36.6 C)-98.2 F (36.8 C)] 98.1 F (36.7 C) (06/02 0600) Pulse Rate:  [78-83] 83 (06/02 0600) Resp:  [18-20] 19 (06/02 0600) BP: (124-139)/(74-81) 127/80 mmHg (06/02 0600) SpO2:  [98 %-100 %] 100 % (06/02 0600) Weight change:     Intake/Output from previous day: 06/01 0701 - 06/02 0700 In: 723 [P.O.:720; I.V.:3] Out: 600 [Urine:600]  PHYSICAL EXAM General appearance: no distress and slowed mentation Resp: diminished breath sounds bilaterally and rhonchi bilaterally Cardio: S1, S2 normal GI: full abdomen, soft and lax, bowel sound is ++ Extremities: extremities normal, atraumatic, no cyanosis or edema  Lab Results:  Results for orders placed or performed during the hospital encounter of 06/18/15 (from the past 48 hour(s))  CBC with Differential     Status: Abnormal   Collection Time: 06/18/15  1:19 PM  Result Value Ref Range   WBC 6.6 4.0 - 10.5 K/uL   RBC 4.33 4.22 - 5.81 MIL/uL   Hemoglobin 11.3 (L) 13.0 - 17.0 g/dL   HCT 34.4 (L) 39.0 - 52.0 %   MCV 79.4 78.0 - 100.0 fL   MCH 26.1 26.0 - 34.0 pg   MCHC 32.8 30.0 - 36.0 g/dL   RDW 20.2 (H) 11.5 - 15.5 %   Platelets 207 150 - 400 K/uL    Comment: SPECIMEN CHECKED FOR CLOTS PLATELET COUNT CONFIRMED BY SMEAR    Neutrophils Relative % 76 %   Neutro Abs 5.0 1.7 - 7.7 K/uL   Lymphocytes Relative 13 %   Lymphs Abs 0.9 0.7 - 4.0 K/uL   Monocytes Relative 10 %   Monocytes Absolute 0.7 0.1 - 1.0 K/uL   Eosinophils Relative 1 %   Eosinophils Absolute 0.1 0.0 - 0.7 K/uL   Basophils Relative 1 %   Basophils Absolute 0.0 0.0 - 0.1 K/uL  Basic metabolic panel     Status: Abnormal   Collection Time: 06/18/15  1:19 PM  Result Value Ref Range   Sodium 132 (L) 135 - 145 mmol/L   Potassium 4.0 3.5 - 5.1 mmol/L   Chloride 103  101 - 111 mmol/L   CO2 22 22 - 32 mmol/L   Glucose, Bld 68 65 - 99 mg/dL   BUN 8 6 - 20 mg/dL   Creatinine, Ser 0.66 0.61 - 1.24 mg/dL   Calcium 8.2 (L) 8.9 - 10.3 mg/dL   GFR calc non Af Amer >60 >60 mL/min   GFR calc Af Amer >60 >60 mL/min    Comment: (NOTE) The eGFR has been calculated using the CKD EPI equation. This calculation has not been validated in all clinical situations. eGFR's persistently <60 mL/min signify possible Chronic Kidney Disease.    Anion gap 7 5 - 15  Hepatic function panel     Status: Abnormal   Collection Time: 06/18/15  1:19 PM  Result Value Ref Range   Total Protein 8.1 6.5 - 8.1 g/dL   Albumin 2.0 (L) 3.5 - 5.0 g/dL   AST 158 (H) 15 - 41 U/L   ALT 64 (H) 17 - 63 U/L   Alkaline Phosphatase 242 (H) 38 - 126 U/L   Total Bilirubin 2.9 (H) 0.3 - 1.2 mg/dL   Bilirubin, Direct 1.2 (H) 0.1 - 0.5 mg/dL   Indirect Bilirubin 1.7 (H) 0.3 - 0.9  mg/dL  Procalcitonin - Baseline     Status: None   Collection Time: 06/18/15  1:19 PM  Result Value Ref Range   Procalcitonin 0.13 ng/mL    Comment:        Interpretation: PCT (Procalcitonin) <= 0.5 ng/mL: Systemic infection (sepsis) is not likely. Local bacterial infection is possible. (NOTE)         ICU PCT Algorithm               Non ICU PCT Algorithm    ----------------------------     ------------------------------         PCT < 0.25 ng/mL                 PCT < 0.1 ng/mL     Stopping of antibiotics            Stopping of antibiotics       strongly encouraged.               strongly encouraged.    ----------------------------     ------------------------------       PCT level decrease by               PCT < 0.25 ng/mL       >= 80% from peak PCT       OR PCT 0.25 - 0.5 ng/mL          Stopping of antibiotics                                             encouraged.     Stopping of antibiotics           encouraged.    ----------------------------     ------------------------------       PCT level decrease by               PCT >= 0.25 ng/mL       < 80% from peak PCT        AND PCT >= 0.5 ng/mL            Continuin g antibiotics                                              encouraged.       Continuing antibiotics            encouraged.    ----------------------------     ------------------------------     PCT level increase compared          PCT > 0.5 ng/mL         with peak PCT AND          PCT >= 0.5 ng/mL             Escalation of antibiotics                                          strongly encouraged.      Escalation of antibiotics        strongly encouraged.   Ammonia     Status: Abnormal   Collection Time: 06/18/15  2:13 PM  Result Value Ref Range  Ammonia 117 (H) 9 - 35 umol/L  Protime-INR     Status: Abnormal   Collection Time: 06/18/15  2:13 PM  Result Value Ref Range   Prothrombin Time 17.0 (H) 11.6 - 15.2 seconds   INR 1.37 0.00 - 1.49  Ethanol     Status: None   Collection Time: 06/18/15  2:13 PM  Result Value Ref Range   Alcohol, Ethyl (B) <5 <5 mg/dL    Comment:        LOWEST DETECTABLE LIMIT FOR SERUM ALCOHOL IS 5 mg/dL FOR MEDICAL PURPOSES ONLY   Urinalysis, Routine w reflex microscopic (not at [facility])     Status: Abnormal   Collection Time: 06/18/15  3:22 PM  Result Value Ref Range   Color, Urine YELLOW YELLOW   APPearance CLEAR CLEAR   Specific Gravity, Urine 1.020 1.005 - 1.030   pH 6.5 5.0 - 8.0   Glucose, UA 100 (A) NEGATIVE mg/dL   Hgb urine dipstick NEGATIVE NEGATIVE   Bilirubin Urine NEGATIVE NEGATIVE   Ketones, ur TRACE (A) NEGATIVE mg/dL   Protein, ur NEGATIVE NEGATIVE mg/dL   Nitrite NEGATIVE NEGATIVE   Leukocytes, UA NEGATIVE NEGATIVE    Comment: MICROSCOPIC NOT DONE ON URINES WITH NEGATIVE PROTEIN, BLOOD, LEUKOCYTES, NITRITE, OR GLUCOSE <1000 mg/dL.  Urine rapid drug screen (hosp performed)     Status: None   Collection Time: 06/18/15  3:22 PM  Result Value Ref Range   Opiates NONE DETECTED NONE DETECTED   Cocaine NONE DETECTED NONE DETECTED    Benzodiazepines NONE DETECTED NONE DETECTED   Amphetamines NONE DETECTED NONE DETECTED   Tetrahydrocannabinol NONE DETECTED NONE DETECTED   Barbiturates NONE DETECTED NONE DETECTED    Comment:        DRUG SCREEN FOR MEDICAL PURPOSES ONLY.  IF CONFIRMATION IS NEEDED FOR ANY PURPOSE, NOTIFY LAB WITHIN 5 DAYS.        LOWEST DETECTABLE LIMITS FOR URINE DRUG SCREEN Drug Class       Cutoff (ng/mL) Amphetamine      1000 Barbiturate      200 Benzodiazepine   163 Tricyclics       846 Opiates          300 Cocaine          300 THC              50   Culture, blood (Routine X 2) w Reflex to ID Panel     Status: None (Preliminary result)   Collection Time: 06/18/15  5:10 PM  Result Value Ref Range   Specimen Description BLOOD RIGHT HAND    Special Requests BOTTLES DRAWN AEROBIC ONLY 4CC    Culture NO GROWTH < 24 HOURS    Report Status PENDING   Culture, blood (Routine X 2) w Reflex to ID Panel     Status: None (Preliminary result)   Collection Time: 06/18/15  5:17 PM  Result Value Ref Range   Specimen Description BLOOD LEFT HAND    Special Requests BOTTLES DRAWN AEROBIC ONLY 5CC    Culture NO GROWTH < 24 HOURS    Report Status PENDING   Lactic acid, plasma     Status: Abnormal   Collection Time: 06/18/15  8:58 PM  Result Value Ref Range   Lactic Acid, Venous 3.4 (HH) 0.5 - 2.0 mmol/L    Comment: CRITICAL RESULT CALLED TO, READ BACK BY AND VERIFIED WITH: HERN,J AT 2140 ON 06/18/2015 BY ISLEY,B   Lactic acid, plasma  Status: None   Collection Time: 06/18/15 11:53 PM  Result Value Ref Range   Lactic Acid, Venous 2.0 0.5 - 2.0 mmol/L  Ammonia     Status: Abnormal   Collection Time: 06/19/15  6:49 AM  Result Value Ref Range   Ammonia 51 (H) 9 - 35 umol/L  Basic metabolic panel     Status: Abnormal   Collection Time: 06/19/15  6:49 AM  Result Value Ref Range   Sodium 133 (L) 135 - 145 mmol/L   Potassium 3.5 3.5 - 5.1 mmol/L   Chloride 108 101 - 111 mmol/L   CO2 21 (L) 22 - 32  mmol/L   Glucose, Bld 98 65 - 99 mg/dL   BUN 7 6 - 20 mg/dL   Creatinine, Ser 0.54 (L) 0.61 - 1.24 mg/dL   Calcium 7.8 (L) 8.9 - 10.3 mg/dL   GFR calc non Af Amer >60 >60 mL/min   GFR calc Af Amer >60 >60 mL/min    Comment: (NOTE) The eGFR has been calculated using the CKD EPI equation. This calculation has not been validated in all clinical situations. eGFR's persistently <60 mL/min signify possible Chronic Kidney Disease.    Anion gap 4 (L) 5 - 15  HIV antibody     Status: None   Collection Time: 06/19/15  6:49 AM  Result Value Ref Range   HIV Screen 4th Generation wRfx Non Reactive Non Reactive    Comment: (NOTE) Performed At: [facility] [place], [place] [id number] Lindon Romp MD EM:[id number]   Procalcitonin     Status: None   Collection Time: 06/20/15  5:29 AM  Result Value Ref Range   Procalcitonin 0.10 ng/mL    Comment:        Interpretation: PCT (Procalcitonin) <= 0.5 ng/mL: Systemic infection (sepsis) is not likely. Local bacterial infection is possible. (NOTE)         ICU PCT Algorithm               Non ICU PCT Algorithm    ----------------------------     ------------------------------         PCT < 0.25 ng/mL                 PCT < 0.1 ng/mL     Stopping of antibiotics            Stopping of antibiotics       strongly encouraged.               strongly encouraged.    ----------------------------     ------------------------------       PCT level decrease by               PCT < 0.25 ng/mL       >= 80% from peak PCT       OR PCT 0.25 - 0.5 ng/mL          Stopping of antibiotics                                             encouraged.     Stopping of antibiotics           encouraged.    ----------------------------     ------------------------------       PCT level decrease by  PCT >= 0.25 ng/mL       < 80% from peak PCT        AND PCT >= 0.5 ng/mL            Continuin g antibiotics                                               encouraged.       Continuing antibiotics            encouraged.    ----------------------------     ------------------------------     PCT level increase compared          PCT > 0.5 ng/mL         with peak PCT AND          PCT >= 0.5 ng/mL             Escalation of antibiotics                                          strongly encouraged.      Escalation of antibiotics        strongly encouraged.   Basic metabolic panel     Status: Abnormal   Collection Time: 06/20/15  5:29 AM  Result Value Ref Range   Sodium 135 135 - 145 mmol/L   Potassium 3.2 (L) 3.5 - 5.1 mmol/L   Chloride 107 101 - 111 mmol/L   CO2 24 22 - 32 mmol/L   Glucose, Bld 112 (H) 65 - 99 mg/dL   BUN 7 6 - 20 mg/dL   Creatinine, Ser 0.59 (L) 0.61 - 1.24 mg/dL   Calcium 7.7 (L) 8.9 - 10.3 mg/dL   GFR calc non Af Amer >60 >60 mL/min   GFR calc Af Amer >60 >60 mL/min    Comment: (NOTE) The eGFR has been calculated using the CKD EPI equation. This calculation has not been validated in all clinical situations. eGFR's persistently <60 mL/min signify possible Chronic Kidney Disease.    Anion gap 4 (L) 5 - 15  Ammonia     Status: Abnormal   Collection Time: 06/20/15  5:29 AM  Result Value Ref Range   Ammonia 38 (H) 9 - 35 umol/L    ABGS No results for input(s): PHART, PO2ART, TCO2, HCO3 in the last 72 hours.  Invalid input(s): PCO2 CULTURES Recent Results (from the past 240 hour(s))  Culture, blood (Routine X 2) w Reflex to ID Panel     Status: None (Preliminary result)   Collection Time: 06/18/15  5:10 PM  Result Value Ref Range Status   Specimen Description BLOOD RIGHT HAND  Final   Special Requests BOTTLES DRAWN AEROBIC ONLY 4CC  Final   Culture NO GROWTH < 24 HOURS  Final   Report Status PENDING  Incomplete  Culture, blood (Routine X 2) w Reflex to ID Panel     Status: None (Preliminary result)   Collection Time: 06/18/15  5:17 PM  Result Value Ref Range Status   Specimen Description BLOOD LEFT  HAND  Final   Special Requests BOTTLES DRAWN AEROBIC ONLY 5CC  Final   Culture NO GROWTH < 24 HOURS  Final   Report Status PENDING  Incomplete   Studies/Results: Ct Head  Wo Contrast  06/18/2015  CLINICAL DATA:  Recent onset confusion/altered mental status. History of tonsil carcinoma EXAM: CT HEAD WITHOUT CONTRAST TECHNIQUE: Contiguous axial images were obtained from the base of the skull through the vertex without intravenous contrast. COMPARISON:  None. FINDINGS: The ventricles are normal in size and configuration. The left lateral ventricle is slightly larger than the right lateral ventricle, an anatomic variant. There is no intracranial mass, hemorrhage, extra-axial fluid collection, or midline shift. Gray-white compartments appear normal. No acute infarct evident. Bony calvarium appears intact. The mastoid air cells are clear. No intraorbital lesions are evident. IMPRESSION: Study within normal limits. Electronically Signed   By: Lowella Grip III M.D.   On: 06/18/2015 12:54   Dg Chest Port 1 View  06/18/2015  CLINICAL DATA:  Confusion and cough EXAM: PORTABLE CHEST 1 VIEW COMPARISON:  03/02/2014 FINDINGS: Cardiac shadow is within normal limits. The right lung is clear. Increased infiltrate is noted in the left upper lobe. No sizable effusion is seen. No bony abnormality is noted. IMPRESSION: Left upper lobe infiltrate. Followup PA and lateral chest X-ray is recommended in 3-4 weeks following trial of antibiotic therapy to ensure resolution and exclude underlying malignancy. Electronically Signed   By: Inez Catalina M.D.   On: 06/18/2015 14:25    Medications: I have reviewed the patient's current medications.  Assesment:   Principal Problem:   HCAP (healthcare-associated pneumonia) Active Problems:   ETOH abuse   Essential hypertension   GERD (gastroesophageal reflux disease)   Cancer, hepatocellular (HCC)   Hypertension   Acute encephalopathy   Hepatic encephalopathy (HCC)    Malnutrition of moderate degree    Plan:  Mediations reviewed Will continue iv antibiotics GI consult appreciated. Continue current treatment    LOS: 2 days   [name] 06/20/2015, 7:57 AM

## 2015-06-21 DIAGNOSIS — G9340 Encephalopathy, unspecified: Secondary | ICD-10-CM

## 2015-06-21 DIAGNOSIS — C220 Liver cell carcinoma: Secondary | ICD-10-CM

## 2015-06-21 DIAGNOSIS — K7290 Hepatic failure, unspecified without coma: Secondary | ICD-10-CM

## 2015-06-21 LAB — LEGIONELLA PNEUMOPHILA SEROGP 1 UR AG: L. PNEUMOPHILA SEROGP 1 UR AG: NEGATIVE

## 2015-06-21 NOTE — Progress Notes (Signed)
Subjective: He was admitted with healthcare associated pneumonia and hepatic encephalopathy. He seems to be doing better. He has no new complaints.  Objective: Vital signs in last 24 hours: Temp:  [98.4 F (36.9 C)-99.1 F (37.3 C)] 98.9 F (37.2 C) (06/03 0531) Pulse Rate:  [84-93] 84 (06/03 0531) Resp:  [18] 18 (06/03 0531) BP: (117-129)/(74-76) 123/74 mmHg (06/03 0531) SpO2:  [99 %-100 %] 99 % (06/03 0531) Weight change:  Last BM Date: 06/20/15  Intake/Output from previous day: 06/02 0701 - 06/03 0700 In: 480 [P.O.:480] Out: -   PHYSICAL EXAM General appearance: alert, cooperative and mild distress Resp: rhonchi bilaterally Cardio: regular rate and rhythm, S1, S2 normal, no murmur, click, rub or gallop GI: soft, non-tender; bowel sounds normal; no masses,  no organomegaly Extremities: extremities normal, atraumatic, no cyanosis or edema  Lab Results:  Results for orders placed or performed during the hospital encounter of 06/18/15 (from the past 48 hour(s))  Procalcitonin     Status: None   Collection Time: 06/20/15  5:29 AM  Result Value Ref Range   Procalcitonin 0.10 ng/mL    Comment:        Interpretation: PCT (Procalcitonin) <= 0.5 ng/mL: Systemic infection (sepsis) is not likely. Local bacterial infection is possible. (NOTE)         ICU PCT Algorithm               Non ICU PCT Algorithm    ----------------------------     ------------------------------         PCT < 0.25 ng/mL                 PCT < 0.1 ng/mL     Stopping of antibiotics            Stopping of antibiotics       strongly encouraged.               strongly encouraged.    ----------------------------     ------------------------------       PCT level decrease by               PCT < 0.25 ng/mL       >= 80% from peak PCT       OR PCT 0.25 - 0.5 ng/mL          Stopping of antibiotics                                             encouraged.     Stopping of antibiotics           encouraged.     ----------------------------     ------------------------------       PCT level decrease by              PCT >= 0.25 ng/mL       < 80% from peak PCT        AND PCT >= 0.5 ng/mL            Continuin g antibiotics                                              encouraged.       Continuing antibiotics  encouraged.    ----------------------------     ------------------------------     PCT level increase compared          PCT > 0.5 ng/mL         with peak PCT AND          PCT >= 0.5 ng/mL             Escalation of antibiotics                                          strongly encouraged.      Escalation of antibiotics        strongly encouraged.   Basic metabolic panel     Status: Abnormal   Collection Time: 06/20/15  5:29 AM  Result Value Ref Range   Sodium 135 135 - 145 mmol/L   Potassium 3.2 (L) 3.5 - 5.1 mmol/L   Chloride 107 101 - 111 mmol/L   CO2 24 22 - 32 mmol/L   Glucose, Bld 112 (H) 65 - 99 mg/dL   BUN 7 6 - 20 mg/dL   Creatinine, Ser 0.59 (L) 0.61 - 1.24 mg/dL   Calcium 7.7 (L) 8.9 - 10.3 mg/dL   GFR calc non Af Amer >60 >60 mL/min   GFR calc Af Amer >60 >60 mL/min    Comment: (NOTE) The eGFR has been calculated using the CKD EPI equation. This calculation has not been validated in all clinical situations. eGFR's persistently <60 mL/min signify possible Chronic Kidney Disease.    Anion gap 4 (L) 5 - 15  Ammonia     Status: Abnormal   Collection Time: 06/20/15  5:29 AM  Result Value Ref Range   Ammonia 38 (H) 9 - 35 umol/L  Strep pneumoniae urinary antigen     Status: None   Collection Time: 06/20/15  5:55 AM  Result Value Ref Range   Strep Pneumo Urinary Antigen NEGATIVE NEGATIVE    Comment:        Infection due to S. pneumoniae cannot be absolutely ruled out since the antigen present may be below the detection limit of the test. Performed at [facility]     ABGS No results for input(s): PHART, PO2ART, TCO2, HCO3 in the last 72  hours.  Invalid input(s): PCO2 CULTURES Recent Results (from the past 240 hour(s))  Culture, blood (Routine X 2) w Reflex to ID Panel     Status: None (Preliminary result)   Collection Time: 06/18/15  5:10 PM  Result Value Ref Range Status   Specimen Description BLOOD RIGHT HAND  Final   Special Requests BOTTLES DRAWN AEROBIC ONLY 4CC  Final   Culture NO GROWTH 3 DAYS  Final   Report Status PENDING  Incomplete  Culture, blood (Routine X 2) w Reflex to ID Panel     Status: None (Preliminary result)   Collection Time: 06/18/15  5:17 PM  Result Value Ref Range Status   Specimen Description BLOOD LEFT HAND  Final   Special Requests BOTTLES DRAWN AEROBIC ONLY 5CC  Final   Culture NO GROWTH 3 DAYS  Final   Report Status PENDING  Incomplete   Studies/Results: No results found.  Medications:  Prior to Admission:  Prescriptions prior to admission  Medication Sig Dispense Refill Last Dose  . albuterol (PROVENTIL HFA;VENTOLIN HFA) 108 (90 BASE) MCG/ACT inhaler Inhale 2 puffs into the lungs 4 (  four) times daily.    Past Week at Unknown time  . amLODipine (NORVASC) 5 MG tablet Take 5 mg by mouth daily.   Past Week at Unknown time  . atorvastatin (LIPITOR) 10 MG tablet Take 10 mg by mouth every evening.   Past Week at Unknown time  . ENULOSE 10 GM/15ML SOLN Take 15 mLs by mouth 2 (two) times daily.   Past Week at Unknown time  . furosemide (LASIX) 40 MG tablet Take 40 mg by mouth daily.   Past Week at Unknown time  . gabapentin (NEURONTIN) 600 MG tablet Take 600 mg by mouth 3 (three) times daily.     Past Week at Unknown time  . meloxicam (MOBIC) 15 MG tablet Take 1 tablet by mouth daily.   Past Week at Unknown time  . omeprazole (PRILOSEC) 40 MG capsule Take 40 mg by mouth daily.     Past Week at Unknown time  . regorafenib (STIVARGA) 40 MG tablet Take 120 mg by mouth See admin instructions. Take 1 tablet by mouth three times daily for 3 weeks, followed by a 1 week break.   Past Week at Unknown  time  . rivaroxaban (XARELTO) 20 MG TABS tablet Take 20 mg by mouth every evening.   Past Week at Unknown time  . SORAfenib (NEXAVAR) 200 MG tablet Take 400 mg by mouth 2 (two) times daily.   Past Week at Unknown time  . spironolactone (ALDACTONE) 25 MG tablet Take 25 mg by mouth 2 (two) times daily.   Past Week at Unknown time  . traMADol (ULTRAM) 50 MG tablet Take 50 mg by mouth every 8 (eight) hours as needed for moderate pain.   Past Week at Unknown time  . ciprofloxacin (CIPRO) 500 MG tablet Take 1 tablet (500 mg total) by mouth 2 (two) times daily. (Patient not taking: Reported on 06/19/2015) 20 tablet 0 Completed Course at Unknown time  . HYDROcodone-acetaminophen (NORCO/VICODIN) 5-325 MG tablet Take 2 tablets by mouth every 4 (four) hours as needed for moderate pain. (Patient not taking: Reported on 04/14/2015) 10 tablet 0 Completed Course at Unknown time  . hydroxypropyl methylcellulose / hypromellose (ISOPTO TEARS / GONIOVISC) 2.5 % ophthalmic solution Place 1 drop into both eyes daily as needed for dry eyes.   unknown  . metroNIDAZOLE (FLAGYL) 250 MG tablet Take 1 tablet (250 mg total) by mouth every 8 (eight) hours. (Patient not taking: Reported on 06/19/2015) 30 tablet 0 Completed Course at Unknown time   Scheduled: . amLODipine  5 mg Oral Daily  . atorvastatin  10 mg Oral QPM  . ceFEPime (MAXIPIME) IV  1 g Intravenous Q8H  . feeding supplement (ENSURE ENLIVE)  237 mL Oral TID BM  . folic acid  1 mg Oral Daily  . furosemide  20 mg Oral Daily  . gabapentin  600 mg Oral TID  . lactulose  20 g Oral TID  . multivitamin with minerals  1 tablet Oral Daily  . pantoprazole  40 mg Oral Daily  . regorafenib  120 mg Oral Q breakfast  . rifaximin  550 mg Oral BID  . rivaroxaban  20 mg Oral QPM  . sodium chloride flush  3 mL Intravenous Q12H  . spironolactone  25 mg Oral BID  . thiamine  100 mg Oral Daily   Or  . thiamine  100 mg Intravenous Daily  . vancomycin  1,000 mg Intravenous Q12H    Continuous:  QRF:XJOITG chloride, albuterol, ibuprofen, LORazepam **OR** LORazepam,  polyvinyl alcohol, sodium chloride flush  Assesment: He has healthcare associated pneumonia. He has a history of significant ethanol abuse and has acute hepatic encephalopathy. He is improving. He is not ready for discharge Principal Problem:   HCAP (healthcare-associated pneumonia) Active Problems:   ETOH abuse   Essential hypertension   GERD (gastroesophageal reflux disease)   Cancer, hepatocellular (HCC)   Hypertension   Acute encephalopathy   Hepatic encephalopathy (HCC)   Malnutrition of moderate degree    Plan: Continue with current treatments. No changes today.    LOS: 3 days   Syreeta Figler L 06/21/2015, 11:31 AM

## 2015-06-21 NOTE — Progress Notes (Signed)
Subjective:  Patient complains of leg pain. No other complaints.   Objective: Vital signs in last 24 hours: Temp:  [98.4 F (36.9 C)-99.1 F (37.3 C)] 98.9 F (37.2 C) (06/03 0531) Pulse Rate:  [84-93] 84 (06/03 0531) Resp:  [18] 18 (06/03 0531) BP: (117-129)/(74-76) 123/74 mmHg (06/03 0531) SpO2:  [99 %-100 %] 99 % (06/03 0531) Last BM Date: 06/20/15 General:   Alert,    pleasant and cooperative in NAD Head:  Normocephalic and atraumatic. Eyes:  Sclera clear, no icterus.  Chest: CTA bilaterally without rales, rhonchi, crackles.    Heart:  Regular rate and rhythm; no murmurs, clicks, rubs,  or gallops. Abdomen:  Soft, nontender and nondistended.  Normal bowel sounds, without guarding, and without rebound.   Extremities:  Without clubbing, deformity or edema. Neurologic:  Alert and  oriented x4;  grossly normal neurologically. Skin:  Intact without significant lesions or rashes. Psych:  Alert and cooperative. Normal mood and affect.  Intake/Output from previous day: 06/02 0701 - 06/03 0700 In: 480 [P.O.:480] Out: -  Intake/Output this shift: Total I/O In: 240 [P.O.:240] Out: -   Lab Results: CBC  Recent Labs  06/18/15 1319  WBC 6.6  HGB 11.3*  HCT 34.4*  MCV 79.4  PLT 207   BMET  Recent Labs  06/18/15 1319 06/19/15 0649 06/20/15 0529  NA 132* 133* 135  K 4.0 3.5 3.2*  CL 103 108 107  CO2 22 21* 24  GLUCOSE 68 98 112*  BUN 8 7 7   CREATININE 0.66 0.54* 0.59*  CALCIUM 8.2* 7.8* 7.7*   LFTs  Recent Labs  06/18/15 1319  BILITOT 2.9*  BILIDIR 1.2*  IBILI 1.7*  ALKPHOS 242*  AST 158*  ALT 64*  PROT 8.1  ALBUMIN 2.0*   No results for input(s): LIPASE in the last 72 hours. PT/INR  Recent Labs  06/18/15 1413  LABPROT 17.0*  INR 1.37      Imaging Studies: Ct Head Wo Contrast  06/18/2015  CLINICAL DATA:  Recent onset confusion/altered mental status. History of tonsil carcinoma EXAM: CT HEAD WITHOUT CONTRAST TECHNIQUE: Contiguous axial images  were obtained from the base of the skull through the vertex without intravenous contrast. COMPARISON:  None. FINDINGS: The ventricles are normal in size and configuration. The left lateral ventricle is slightly larger than the right lateral ventricle, an anatomic variant. There is no intracranial mass, hemorrhage, extra-axial fluid collection, or midline shift. Gray-white compartments appear normal. No acute infarct evident. Bony calvarium appears intact. The mastoid air cells are clear. No intraorbital lesions are evident. IMPRESSION: Study within normal limits. Electronically Signed   By: Lowella Grip III M.D.   On: 06/18/2015 12:54   Dg Chest Port 1 View  06/18/2015  CLINICAL DATA:  Confusion and cough EXAM: PORTABLE CHEST 1 VIEW COMPARISON:  03/02/2014 FINDINGS: Cardiac shadow is within normal limits. The right lung is clear. Increased infiltrate is noted in the left upper lobe. No sizable effusion is seen. No bony abnormality is noted. IMPRESSION: Left upper lobe infiltrate. Followup PA and lateral chest X-ray is recommended in 3-4 weeks following trial of antibiotic therapy to ensure resolution and exclude underlying malignancy. Electronically Signed   By: Inez Catalina M.D.   On: 06/18/2015 14:25  [2 weeks]   Assessment:  #1. Hepatic encephalopathy in a patient with advanced HCC. He is doing much better with combination of lactulose and Xifaxan. He will need to continue both of these medications when he is discharged. Patient has left  upper lobe infiltrate but he is not febrile and does not appear to be toxic. Presuming it is an infection it could explain hepatic encephalopathy. #2. hepatocellular carcinoma. He received TARE at [facility] and now is on Regoranefib which he is getting via [facility]. He needs to be back on his medication ASAP, waiting for his brother to bring it. #3. Bilateral knee pain.   Further workup per Dr. Legrand Rams. #4. Left upper lobe infiltrate. Patient is on cefepime and  vancomycin.   Plan: 1. Continue lactulose and xifaxin.  2. Begin Regoranefib 120mg  daily once patient's brother brings medication to hospital. Will have nursing staff to contact brother.  3. Will follow with you. Dr. Laural Golden to resume GI care Monday 06/23/15.   Laureen Ochs. Bernarda Caffey [facility] [phone] 6/3/201711:48 AM     LOS: 3 days

## 2015-06-21 NOTE — Progress Notes (Signed)
Patients brother brought Stivarga to unit for patient.  Patient took dose of 120 mg out of own supply this evening.  Medicine given to [facility] to be taken to pharmacy to be dispensed for further doses while in hospital.

## 2015-06-22 LAB — VANCOMYCIN, TROUGH: Vancomycin Tr: 17 ug/mL (ref 10.0–20.0)

## 2015-06-22 LAB — PROCALCITONIN: Procalcitonin: 0.1 ng/mL

## 2015-06-22 NOTE — Progress Notes (Signed)
Pharmacy Antibiotic Note  [name] is a 60 y.o. male admitted on 06/18/2015 with pneumonia.  Pharmacy has been consulted for Vancomycin and Cefepime dosing.  Day #5 of therapy patient slowly improving. VT= 18mcg/ml this AM, therapeutic. Continue current regimen.  Plan: ContinueVancomycin 1gm IV q12hrs Monitor labs, micro and vitals.  Continue Cefepime 1gm IV q8h Deescalate as indicated  Height: 5\' 8"  (172.7 cm) Weight: 168 lb (76.204 kg) IBW/kg (Calculated) : 68.4  Temp (24hrs), Avg:98.6 F (37 C), Min:98.5 F (36.9 C), Max:98.7 F (37.1 C)   Recent Labs Lab 06/18/15 1319 06/18/15 2058 06/18/15 2353 06/19/15 0649 06/20/15 0529 06/22/15 0850  WBC 6.6  --   --   --   --   --   CREATININE 0.66  --   --  0.54* 0.59*  --   LATICACIDVEN  --  3.4* 2.0  --   --   --   VANCOTROUGH  --   --   --   --   --  17    Estimated Creatinine Clearance: 96.2 mL/min (by C-G formula based on Cr of 0.59).    No Known Allergies  Antimicrobials this admission: Vanc 5/31 >>  Cefepime 5/31 >>   Dose adjustments this admission: N/A  Microbiology results: 5/31 BCx: ngtd MRSA PCR: not done  Thank you for allowing pharmacy to be a part of this patient's care. Isac Sarna, BS [place], BCPS Clinical Pharmacist Pager [phone]  Cristy Friedlander 06/22/2015 11:07 AM

## 2015-06-22 NOTE — Progress Notes (Signed)
Subjective: He says he feels a little better. He sitting up and eating today.  Objective: Vital signs in last 24 hours: Temp:  [98.5 F (36.9 C)-98.7 F (37.1 C)] 98.7 F (37.1 C) (06/04 0645) Pulse Rate:  [68-84] 84 (06/04 0645) Resp:  [18] 18 (06/04 0645) BP: (113-129)/(69-80) 113/69 mmHg (06/04 0645) SpO2:  [98 %-100 %] 100 % (06/04 0645) Weight change:  Last BM Date: 06/21/15  Intake/Output from previous day: 06/03 0701 - 06/04 0700 In: 2030 [P.O.:480; IV Piggyback:1550] Out: -   PHYSICAL EXAM General appearance: alert, cooperative and mild distress Resp: rales bilaterally and rhonchi bilaterally Cardio: regular rate and rhythm, S1, S2 normal, no murmur, click, rub or gallop GI: soft, non-tender; bowel sounds normal; no masses,  no organomegaly Extremities: extremities normal, atraumatic, no cyanosis or edema  Lab Results:  No results found for this or any previous visit (from the past 48 hour(s)).  ABGS No results for input(s): PHART, PO2ART, TCO2, HCO3 in the last 72 hours.  Invalid input(s): PCO2 CULTURES Recent Results (from the past 240 hour(s))  Culture, blood (Routine X 2) w Reflex to ID Panel     Status: None (Preliminary result)   Collection Time: 06/18/15  5:10 PM  Result Value Ref Range Status   Specimen Description BLOOD RIGHT HAND  Final   Special Requests BOTTLES DRAWN AEROBIC ONLY 4CC  Final   Culture NO GROWTH 4 DAYS  Final   Report Status PENDING  Incomplete  Culture, blood (Routine X 2) w Reflex to ID Panel     Status: None (Preliminary result)   Collection Time: 06/18/15  5:17 PM  Result Value Ref Range Status   Specimen Description BLOOD LEFT HAND  Final   Special Requests BOTTLES DRAWN AEROBIC ONLY 5CC  Final   Culture NO GROWTH 4 DAYS  Final   Report Status PENDING  Incomplete   Studies/Results: No results found.  Medications:  Prior to Admission:  Prescriptions prior to admission  Medication Sig Dispense Refill Last Dose  .  albuterol (PROVENTIL HFA;VENTOLIN HFA) 108 (90 BASE) MCG/ACT inhaler Inhale 2 puffs into the lungs 4 (four) times daily.    Past Week at Unknown time  . amLODipine (NORVASC) 5 MG tablet Take 5 mg by mouth daily.   Past Week at Unknown time  . atorvastatin (LIPITOR) 10 MG tablet Take 10 mg by mouth every evening.   Past Week at Unknown time  . ENULOSE 10 GM/15ML SOLN Take 15 mLs by mouth 2 (two) times daily.   Past Week at Unknown time  . furosemide (LASIX) 40 MG tablet Take 40 mg by mouth daily.   Past Week at Unknown time  . gabapentin (NEURONTIN) 600 MG tablet Take 600 mg by mouth 3 (three) times daily.     Past Week at Unknown time  . meloxicam (MOBIC) 15 MG tablet Take 1 tablet by mouth daily.   Past Week at Unknown time  . omeprazole (PRILOSEC) 40 MG capsule Take 40 mg by mouth daily.     Past Week at Unknown time  . regorafenib (STIVARGA) 40 MG tablet Take 120 mg by mouth See admin instructions. Take 1 tablet by mouth three times daily for 3 weeks, followed by a 1 week break.   Past Week at Unknown time  . rivaroxaban (XARELTO) 20 MG TABS tablet Take 20 mg by mouth every evening.   Past Week at Unknown time  . SORAfenib (NEXAVAR) 200 MG tablet Take 400 mg by mouth 2 (  two) times daily.   Past Week at Unknown time  . spironolactone (ALDACTONE) 25 MG tablet Take 25 mg by mouth 2 (two) times daily.   Past Week at Unknown time  . traMADol (ULTRAM) 50 MG tablet Take 50 mg by mouth every 8 (eight) hours as needed for moderate pain.   Past Week at Unknown time  . ciprofloxacin (CIPRO) 500 MG tablet Take 1 tablet (500 mg total) by mouth 2 (two) times daily. (Patient not taking: Reported on 06/19/2015) 20 tablet 0 Completed Course at Unknown time  . HYDROcodone-acetaminophen (NORCO/VICODIN) 5-325 MG tablet Take 2 tablets by mouth every 4 (four) hours as needed for moderate pain. (Patient not taking: Reported on 04/14/2015) 10 tablet 0 Completed Course at Unknown time  . hydroxypropyl methylcellulose /  hypromellose (ISOPTO TEARS / GONIOVISC) 2.5 % ophthalmic solution Place 1 drop into both eyes daily as needed for dry eyes.   unknown  . metroNIDAZOLE (FLAGYL) 250 MG tablet Take 1 tablet (250 mg total) by mouth every 8 (eight) hours. (Patient not taking: Reported on 06/19/2015) 30 tablet 0 Completed Course at Unknown time   Scheduled: . amLODipine  5 mg Oral Daily  . atorvastatin  10 mg Oral QPM  . ceFEPime (MAXIPIME) IV  1 g Intravenous Q8H  . feeding supplement (ENSURE ENLIVE)  237 mL Oral TID BM  . folic acid  1 mg Oral Daily  . furosemide  20 mg Oral Daily  . gabapentin  600 mg Oral TID  . lactulose  20 g Oral TID  . multivitamin with minerals  1 tablet Oral Daily  . pantoprazole  40 mg Oral Daily  . regorafenib  120 mg Oral Q breakfast  . rifaximin  550 mg Oral BID  . rivaroxaban  20 mg Oral QPM  . sodium chloride flush  3 mL Intravenous Q12H  . spironolactone  25 mg Oral BID  . thiamine  100 mg Oral Daily   Or  . thiamine  100 mg Intravenous Daily  . vancomycin  1,000 mg Intravenous Q12H   Continuous:  [id number] chloride, albuterol, ibuprofen, polyvinyl alcohol, sodium chloride flush  Assesment: He was admitted with healthcare associated pneumonia. He is better. He had acute encephalopathy which was hepatic in nature. He is improving. He is not quite ready for discharge. Principal Problem:   HCAP (healthcare-associated pneumonia) Active Problems:   ETOH abuse   Essential hypertension   GERD (gastroesophageal reflux disease)   Cancer, hepatocellular (HCC)   Hypertension   Acute encephalopathy   Hepatic encephalopathy (HCC)   Malnutrition of moderate degree    Plan: Continue current antibiotics potential discharge early next week    LOS: 4 days   [name] L 06/22/2015, 8:16 AM

## 2015-06-23 ENCOUNTER — Inpatient Hospital Stay (HOSPITAL_COMMUNITY): Payer: Medicare Other

## 2015-06-23 LAB — BASIC METABOLIC PANEL
Anion gap: 6 (ref 5–15)
BUN: 7 mg/dL (ref 6–20)
CO2: 23 mmol/L (ref 22–32)
CREATININE: 0.52 mg/dL — AB (ref 0.61–1.24)
Calcium: 7.7 mg/dL — ABNORMAL LOW (ref 8.9–10.3)
Chloride: 102 mmol/L (ref 101–111)
Glucose, Bld: 80 mg/dL (ref 65–99)
Potassium: 3.8 mmol/L (ref 3.5–5.1)
SODIUM: 131 mmol/L — AB (ref 135–145)

## 2015-06-23 LAB — CULTURE, BLOOD (ROUTINE X 2)
CULTURE: NO GROWTH
Culture: NO GROWTH

## 2015-06-23 NOTE — Progress Notes (Signed)
Subjective: Patient is more alert and awake. His breathing is better. He is complaining of left knee pain. He has difficulty to ambulate. .   Objective: Vital signs in last 24 hours: Temp:  [98.1 F (36.7 C)-98.7 F (37.1 C)] 98.7 F (37.1 C) (06/05 0532) Pulse Rate:  [81-84] 81 (06/05 0532) Resp:  [18] 18 (06/05 0532) BP: (117-129)/(73-81) 117/73 mmHg (06/05 0532) SpO2:  [99 %-100 %] 99 % (06/05 0532) Weight change:  Last BM Date: 06/21/15  Intake/Output from previous day: 06/04 0701 - 06/05 0700 In: 1390 [P.O.:840; IV Piggyback:550] Out: -   PHYSICAL EXAM General appearance: no distress and slowed mentation Resp: diminished breath sounds bilaterally and rhonchi bilaterally Cardio: S1, S2 normal GI: full abdomen, soft and lax, bowel sound is ++ Extremities: extremities normal, atraumatic, no cyanosis or edema  Lab Results:  Results for orders placed or performed during the hospital encounter of 06/18/15 (from the past 48 hour(s))  Procalcitonin     Status: None   Collection Time: 06/22/15  5:58 AM  Result Value Ref Range   Procalcitonin 0.10 ng/mL    Comment:        Interpretation: PCT (Procalcitonin) <= 0.5 ng/mL: Systemic infection (sepsis) is not likely. Local bacterial infection is possible. (NOTE)         ICU PCT Algorithm               Non ICU PCT Algorithm    ----------------------------     ------------------------------         PCT < 0.25 ng/mL                 PCT < 0.1 ng/mL     Stopping of antibiotics            Stopping of antibiotics       strongly encouraged.               strongly encouraged.    ----------------------------     ------------------------------       PCT level decrease by               PCT < 0.25 ng/mL       >= 80% from peak PCT       OR PCT 0.25 - 0.5 ng/mL          Stopping of antibiotics                                             encouraged.     Stopping of antibiotics           encouraged.    ----------------------------      ------------------------------       PCT level decrease by              PCT >= 0.25 ng/mL       < 80% from peak PCT        AND PCT >= 0.5 ng/mL            Continuin g antibiotics                                              encouraged.       Continuing antibiotics  encouraged.    ----------------------------     ------------------------------     PCT level increase compared          PCT > 0.5 ng/mL         with peak PCT AND          PCT >= 0.5 ng/mL             Escalation of antibiotics                                          strongly encouraged.      Escalation of antibiotics        strongly encouraged.   Vancomycin, trough     Status: None   Collection Time: 06/22/15  8:50 AM  Result Value Ref Range   Vancomycin Tr 17 10.0 - 20.0 ug/mL  Basic metabolic panel     Status: Abnormal   Collection Time: 06/23/15  5:51 AM  Result Value Ref Range   Sodium 131 (L) 135 - 145 mmol/L   Potassium 3.8 3.5 - 5.1 mmol/L   Chloride 102 101 - 111 mmol/L   CO2 23 22 - 32 mmol/L   Glucose, Bld 80 65 - 99 mg/dL   BUN 7 6 - 20 mg/dL   Creatinine, Ser 0.52 (L) 0.61 - 1.24 mg/dL   Calcium 7.7 (L) 8.9 - 10.3 mg/dL   GFR calc non Af Amer >60 >60 mL/min   GFR calc Af Amer >60 >60 mL/min    Comment: (NOTE) The eGFR has been calculated using the CKD EPI equation. This calculation has not been validated in all clinical situations. eGFR's persistently <60 mL/min signify possible Chronic Kidney Disease.    Anion gap 6 5 - 15    ABGS No results for input(s): PHART, PO2ART, TCO2, HCO3 in the last 72 hours.  Invalid input(s): PCO2 CULTURES Recent Results (from the past 240 hour(s))  Culture, blood (Routine X 2) w Reflex to ID Panel     Status: None (Preliminary result)   Collection Time: 06/18/15  5:10 PM  Result Value Ref Range Status   Specimen Description BLOOD RIGHT HAND  Final   Special Requests BOTTLES DRAWN AEROBIC ONLY 4CC  Final   Culture NO GROWTH 4 DAYS  Final   Report  Status PENDING  Incomplete  Culture, blood (Routine X 2) w Reflex to ID Panel     Status: None (Preliminary result)   Collection Time: 06/18/15  5:17 PM  Result Value Ref Range Status   Specimen Description BLOOD LEFT HAND  Final   Special Requests BOTTLES DRAWN AEROBIC ONLY 5CC  Final   Culture NO GROWTH 4 DAYS  Final   Report Status PENDING  Incomplete   Studies/Results: No results found.  Medications: I have reviewed the patient's current medications.  Assesment:   Principal Problem:   HCAP (healthcare-associated pneumonia) Active Problems:   ETOH abuse   Essential hypertension   GERD (gastroesophageal reflux disease)   Cancer, hepatocellular (HCC)   Hypertension   Acute encephalopathy   Hepatic encephalopathy (HCC)   Malnutrition of moderate degree    Plan:  Mediations reviewed Will continue iv antibiotics Will do x-ray of the left knee. As per GI recommendation.   LOS: 5 days   Chanler Schreiter 06/23/2015, 8:31 AM

## 2015-06-23 NOTE — Progress Notes (Signed)
   Subjective:  Asian continues to complain of left knee pain. He denies abdominal pain nausea vomiting melena or rectal bleeding. He states he's having at least 2 bowel movements daily. He states cough has decreased. He denies chest pain or shortness of breath.   Objective: Blood pressure 117/73, pulse 81, temperature 98.7 F (37.1 C), temperature source Oral, resp. rate 18, height 5\' 8"  (1.727 m), weight 168 lb (76.204 kg), SpO2 99 %. Patient is alert and in no acute distress. Asterixis absent. Abdomen is soft and nontender without masses. Liver edge firm below RCM. No LE edema or clubbing noted.  Labs/studies Results:  BMET   Recent Labs  06/23/15 0551  NA 131*  K 3.8  CL 102  CO2 23  GLUCOSE 80  BUN 7  CREATININE 0.52*  CALCIUM 7.7*     Assessment:  #1.Hepatic encephalopathy. Patient appears to be back at his baseline. Theology possibly related to pneumonia. #2. [place]. Patient is back on Regorafenib kinase inhibitor which he is getting wire [facility](restricted use in [place]). #3.Pneumonia. Legionella and strep pneumoniae urinary antigens negative.  #4.Left knee pain. Dr. Legrand Rams obtained the films which suggest osteoarthrosis. Chin is on ibuprofen on as-needed basis.  #5. Mild hyponatremia possibly secondary to furosemide..    Recommendations:  Serum ammonia with a.m. Lab. Keep ibuprofen use to minimum given that he is also on Xarelto.

## 2015-06-23 NOTE — Progress Notes (Signed)
Physical Therapy Treatment Patient Details Name: [name] MRN: [id number] DOB: 1955-08-15 Today's Date: 06/23/2015    History of Present Illness 60 yo M admitted with Generalized weakness and confusion. Acute encephalopathy - likely hepatic due to elevated ammonia levels, ?HCAP with LUL infiltrate on CXR concerning for PNA, treated as HCAP given recent hospital admission.  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  Stated he was tired, but wanted to get up.  Pt demonstrated much improvement today, and ambulated 433ft with RW and supervision, however required 2 seated rest breaks due to B LE pain and fatigue. Will continue to progress with strengthening exercises and continued therapeutic activity.    Follow Up Recommendations  Home health PT;SNF     Equipment Recommendations       Recommendations for Other Services       Precautions / Restrictions Restrictions Weight Bearing Restrictions: No    Mobility  Bed Mobility Overal bed mobility: Modified Independent                Transfers Overall transfer level: Needs assistance Equipment used: Rolling walker (2 wheeled) Transfers: Sit to/from Stand Sit to Stand: Supervision         General transfer comment: vc's for hand placement  Ambulation/Gait Ambulation/Gait assistance: Supervision Ambulation Distance (Feet): 400 Feet Assistive device: Rolling walker (2 wheeled) Gait Pattern/deviations: Step-through pattern;Trunk flexed     General Gait Details: 2 seated rest breaks during gait due to fatigue and LE pain.    Stairs            Wheelchair Mobility    Modified Rankin (Stroke Patients Only)       Balance            Standing balance support: Bilateral upper extremity supported Standing balance-Leahy Scale: Good                      Cognition Arousal/Alertness: Awake/alert Behavior During Therapy: WFL for tasks assessed/performed Overall Cognitive Status: Within Functional Limits for tasks assessed                      Exercises      General Comments        Pertinent Vitals/Pain Pain Assessment: 0-10 Pain Score: 7  Pain Location: B LE's.  Pain Descriptors / Indicators: Burning Pain Intervention(s): Limited activity within patient's tolerance;Repositioned    Home Living                      Prior Function            PT Goals (current goals can now be found in the care plan section) Acute Rehab PT Goals Time For Goal Achievement: 07/03/15 Potential to Achieve Goals: Fair Progress towards PT goals: Progressing toward goals    Frequency  Min 3X/week    PT Plan Frequency needs to be updated    Co-evaluation             End of Session Equipment Utilized During Treatment: Gait belt Activity Tolerance: Patient tolerated treatment well Patient left: in chair;with call bell/phone within reach     Time: [id number] PT Time Calculation (min) (ACUTE ONLY): 23 min  Charges:  $  Gait Training: 23-37 mins                    G Codes:      [name] 01-Jul-2015, 4:44 PM

## 2015-06-23 NOTE — Care Management Important Message (Signed)
Important Message  Patient Details  Name: [name] MRN: [id number] Date of Birth: 1955/03/02   Medicare Important Message Given:  Yes    Sherald Barge, RN 06/23/2015, 11:26 AM

## 2015-06-24 LAB — AMMONIA: AMMONIA: 42 umol/L — AB (ref 9–35)

## 2015-06-24 NOTE — Care Management Note (Signed)
Case Management Note  Patient Details  Name: [name] MRN: [id number] Date of Birth: [birth year]    Expected Discharge Date:             06/25/2015    Expected Discharge Plan:  [place]  In-House Referral:  Clinical Social Work  Discharge planning Services  CM Consult  Post Acute Care Choice:    Choice offered to:  Sibling  DME Arranged:  N/A DME Agency:     HH Arranged:  RN, PT [place] Agency:  [place]  Status of Service:     Medicare Important Message Given:  Yes Date Medicare IM Given:    Medicare IM give by:    Date Additional Medicare IM Given:    Additional Medicare Important Message give by:     If discussed at [place] of Stay Meetings, dates discussed:  Discussed 06/25/2015  Additional Comments:  Parker Wherley, Chauncey Reading, RN 06/24/2015, 3:10 PM

## 2015-06-24 NOTE — Progress Notes (Signed)
Subjective: Patient had epistaxis since last night. The bleeding has stopped now. He doesn't feel well. His knee x-ray showed mild arthritis  Objective: Vital signs in last 24 hours: Temp:  [98.5 F (36.9 C)-98.9 F (37.2 C)] 98.9 F (37.2 C) (06/06 0615) Pulse Rate:  [85-92] 86 (06/06 0615) Resp:  [18-20] 19 (06/06 0615) BP: (113-129)/(74-76) 115/76 mmHg (06/06 0615) SpO2:  [99 %-100 %] 99 % (06/06 0615) Weight change:  Last BM Date: 06/24/15  Intake/Output from previous day: 06/05 0701 - 06/06 0700 In: 480 [P.O.:480] Out: -   PHYSICAL EXAM General appearance: no distress and slowed mentation Resp: diminished breath sounds bilaterally and rhonchi bilaterally Cardio: S1, S2 normal GI: full abdomen, soft and lax, bowel sound is ++ Extremities: extremities normal, atraumatic, no cyanosis or edema  Lab Results:  Results for orders placed or performed during the hospital encounter of 06/18/15 (from the past 48 hour(s))  Basic metabolic panel     Status: Abnormal   Collection Time: 06/23/15  5:51 AM  Result Value Ref Range   Sodium 131 (L) 135 - 145 mmol/L   Potassium 3.8 3.5 - 5.1 mmol/L   Chloride 102 101 - 111 mmol/L   CO2 23 22 - 32 mmol/L   Glucose, Bld 80 65 - 99 mg/dL   BUN 7 6 - 20 mg/dL   Creatinine, Ser 0.52 (L) 0.61 - 1.24 mg/dL   Calcium 7.7 (L) 8.9 - 10.3 mg/dL   GFR calc non Af Amer >60 >60 mL/min   GFR calc Af Amer >60 >60 mL/min    Comment: (NOTE) The eGFR has been calculated using the CKD EPI equation. This calculation has not been validated in all clinical situations. eGFR's persistently <60 mL/min signify possible Chronic Kidney Disease.    Anion gap 6 5 - 15  Ammonia     Status: Abnormal   Collection Time: 06/24/15  5:40 AM  Result Value Ref Range   Ammonia 42 (H) 9 - 35 umol/L    ABGS No results for input(s): PHART, PO2ART, TCO2, HCO3 in the last 72 hours.  Invalid input(s): PCO2 CULTURES Recent Results (from the past 240 hour(s))   Culture, blood (Routine X 2) w Reflex to ID Panel     Status: None   Collection Time: 06/18/15  5:10 PM  Result Value Ref Range Status   Specimen Description BLOOD RIGHT HAND  Final   Special Requests BOTTLES DRAWN AEROBIC ONLY 4CC  Final   Culture NO GROWTH 5 DAYS  Final   Report Status 06/23/2015 FINAL  Final  Culture, blood (Routine X 2) w Reflex to ID Panel     Status: None   Collection Time: 06/18/15  5:17 PM  Result Value Ref Range Status   Specimen Description BLOOD LEFT HAND  Final   Special Requests BOTTLES DRAWN AEROBIC ONLY 5CC  Final   Culture NO GROWTH 5 DAYS  Final   Report Status 06/23/2015 FINAL  Final   Studies/Results: Dg Knee 1-2 Views Left  06/23/2015  CLINICAL DATA:  Pain and swelling without trauma. EXAM: LEFT KNEE - 1-2 VIEW COMPARISON:  None. FINDINGS: Lateral view is moderately oblique. Given this factor, no fracture or dislocation. There is mild medial compartment joint space narrowing and subchondral sclerosis. There is probable mild patellofemoral degenerative change. No gross joint effusion. IMPRESSION: Mild osteoarthritis, without acute osseous abnormality. Suboptimal lateral view. Electronically Signed   By: Abigail Miyamoto M.D.   On: 06/23/2015 10:04    Medications: I have  reviewed the patient's current medications.  Assesment:   Principal Problem:   HCAP (healthcare-associated pneumonia) Active Problems:   ETOH abuse   Essential hypertension   GERD (gastroesophageal reflux disease)   Cancer, hepatocellular (HCC)   Hypertension   Acute encephalopathy   Hepatic encephalopathy (HCC)   Malnutrition of moderate degree    Plan:  Mediations reviewed Will continue iv antibiotics Will monitor CBC As per GI recommendation.   LOS: 6 days   Masayoshi Couzens 06/24/2015, 1:33 PM

## 2015-06-24 NOTE — Progress Notes (Signed)
   Subjective:  Today patient is complaining of feeling nauseated but denies abdominal pain or vomiting. He also denies melena or rectal bleeding. He denies shortness of breath or chest pain.   Objective: Blood pressure 123/74, pulse 79, temperature 98 F (36.7 C), temperature source Oral, resp. rate 19, height 5\' 8"  (1.727 m), weight 168 lb (76.204 kg), SpO2 98 %. Patient is alert and in no acute distress. Asterixis absent. Abdomen is full. Bowel sounds are normal. On palpation is soft with mild midepigastric tenderness. No LE edema or clubbing noted.  Labs/studies Results:  Serum ammonia 42; it was 38 4 days ago and 117 on admission.   Assessment:  #1.Hepatic encephalopathy. Patient appears to be back at his baseline. Etiology possibly related to pneumonia.  #2. [place]. Patient is back on Regorafenib kinase inhibitor which he is getting wire [facility](restricted use in [place]). #3.Pneumonia. Legionella and strep pneumoniae urinary antigens negative. [place]ation has no symptoms at the present time. #4. Nausea possibly related to ibuprofen. He has had multiple doses since admission but none today.    Recommendations:  Discontinue ibuprofen. Resume lactulose and Xifaxan at the time of discharge.

## 2015-06-25 LAB — CBC
HEMATOCRIT: 25.6 % — AB (ref 39.0–52.0)
HEMOGLOBIN: 8.6 g/dL — AB (ref 13.0–17.0)
MCH: 26.5 pg (ref 26.0–34.0)
MCHC: 33.6 g/dL (ref 30.0–36.0)
MCV: 78.8 fL (ref 78.0–100.0)
Platelets: 97 10*3/uL — ABNORMAL LOW (ref 150–400)
RBC: 3.25 MIL/uL — ABNORMAL LOW (ref 4.22–5.81)
RDW: 20.5 % — AB (ref 11.5–15.5)
WBC: 4 10*3/uL (ref 4.0–10.5)

## 2015-06-25 LAB — COMPREHENSIVE METABOLIC PANEL
ALK PHOS: 175 U/L — AB (ref 38–126)
ALT: 59 U/L (ref 17–63)
ANION GAP: 5 (ref 5–15)
AST: 137 U/L — ABNORMAL HIGH (ref 15–41)
Albumin: 1.6 g/dL — ABNORMAL LOW (ref 3.5–5.0)
BILIRUBIN TOTAL: 1.3 mg/dL — AB (ref 0.3–1.2)
BUN: 8 mg/dL (ref 6–20)
CALCIUM: 7.8 mg/dL — AB (ref 8.9–10.3)
CO2: 25 mmol/L (ref 22–32)
Chloride: 103 mmol/L (ref 101–111)
Creatinine, Ser: 0.5 mg/dL — ABNORMAL LOW (ref 0.61–1.24)
GFR calc Af Amer: >60 mL/min (ref >60)
Glucose, Bld: 92 mg/dL (ref 65–99)
POTASSIUM: 3.4 mmol/L — AB (ref 3.5–5.1)
Sodium: 133 mmol/L — ABNORMAL LOW (ref 135–145)
TOTAL PROTEIN: 7 g/dL (ref 6.5–8.1)

## 2015-06-25 MED ORDER — LEVOFLOXACIN 500 MG PO TABS: 500.0000 mg | ORAL_TABLET | Freq: Every day | ORAL | Status: DC

## 2015-06-25 MED ORDER — THIAMINE HCL 100 MG PO TABS: 100.0000 mg | ORAL_TABLET | Freq: Every day | ORAL | Status: AC

## 2015-06-25 MED ORDER — RIFAXIMIN 550 MG PO TABS: 550.0000 mg | ORAL_TABLET | Freq: Two times a day (BID) | ORAL | Status: AC

## 2015-06-25 MED ORDER — LACTULOSE 10 GM/15ML PO SOLN: 20.0000 g | Freq: Three times a day (TID) | ORAL | Status: AC

## 2015-06-25 NOTE — Discharge Summary (Signed)
Physician Discharge Summary  Patient ID: [name] MRN: [id number] DOB/AGE: 03/16/1955 60 y.o. Primary Care Physician:Bernie Ransford, MD Admit date: 06/18/2015 Discharge date: 06/25/2015    Discharge Diagnoses:   Principal Problem:   HCAP (healthcare-associated pneumonia) Active Problems:   ETOH abuse   Essential hypertension   GERD (gastroesophageal reflux disease)   Cancer, hepatocellular (HCC)   Hypertension   Acute encephalopathy   Hepatic encephalopathy (HCC)   Malnutrition of moderate degree     Medication List    STOP taking these medications        meloxicam 15 MG tablet  Commonly known as:  MOBIC     traMADol 50 MG tablet  Commonly known as:  ULTRAM      TAKE these medications        albuterol 108 (90 Base) MCG/ACT inhaler  Commonly known as:  PROVENTIL HFA;VENTOLIN HFA  Inhale 2 puffs into the lungs 4 (four) times daily.     amLODipine 5 MG tablet  Commonly known as:  NORVASC  Take 5 mg by mouth daily.     atorvastatin 10 MG tablet  Commonly known as:  LIPITOR  Take 10 mg by mouth every evening.     ciprofloxacin 500 MG tablet  Commonly known as:  CIPRO  Take 1 tablet (500 mg total) by mouth 2 (two) times daily.     ENULOSE 10 GM/15ML Soln  Generic drug:  lactulose (encephalopathy)  Take 15 mLs by mouth 2 (two) times daily.     furosemide 40 MG tablet  Commonly known as:  LASIX  Take 40 mg by mouth daily.     gabapentin 600 MG tablet  Commonly known as:  NEURONTIN  Take 600 mg by mouth 3 (three) times daily.     HYDROcodone-acetaminophen 5-325 MG tablet  Commonly known as:  NORCO/VICODIN  Take 2 tablets by mouth every 4 (four) hours as needed for moderate pain.     hydroxypropyl methylcellulose / hypromellose 2.5 % ophthalmic solution  Commonly known as:  ISOPTO TEARS / GONIOVISC  Place 1 drop into both eyes daily as needed for dry eyes.     lactulose 10 GM/15ML solution  Commonly known as:  CHRONULAC  Take 30 mLs (20 g total)  by mouth 3 (three) times daily.     levofloxacin 500 MG tablet  Commonly known as:  LEVAQUIN  Take 1 tablet (500 mg total) by mouth daily.     metroNIDAZOLE 250 MG tablet  Commonly known as:  FLAGYL  Take 1 tablet (250 mg total) by mouth every 8 (eight) hours.     omeprazole 40 MG capsule  Commonly known as:  PRILOSEC  Take 40 mg by mouth daily.     regorafenib 40 MG tablet  Commonly known as:  STIVARGA  Take 120 mg by mouth See admin instructions. Take 1 tablet by mouth three times daily for 3 weeks, followed by a 1 week break.     rifaximin 550 MG Tabs tablet  Commonly known as:  XIFAXAN  Take 1 tablet (550 mg total) by mouth 2 (two) times daily.     rivaroxaban 20 MG Tabs tablet  Commonly known as:  XARELTO  Take 20 mg by mouth every evening.     SORAfenib 200 MG tablet  Commonly known as:  NEXAVAR  Take 400 mg by mouth 2 (two) times daily.     spironolactone 25 MG tablet  Commonly known as:  ALDACTONE  Take 25 mg by mouth 2 (  two) times daily.     thiamine 100 MG tablet  Take 1 tablet (100 mg total) by mouth daily.        Discharged Condition: stable    Consults: GI  Significant Diagnostic Studies: Dg Knee 1-2 Views Left  07-05-2015  CLINICAL DATA:  Pain and swelling without trauma. EXAM: LEFT KNEE - 1-2 VIEW COMPARISON:  None. FINDINGS: Lateral view is moderately oblique. Given this factor, no fracture or dislocation. There is mild medial compartment joint space narrowing and subchondral sclerosis. There is probable mild patellofemoral degenerative change. No gross joint effusion. IMPRESSION: Mild osteoarthritis, without acute osseous abnormality. Suboptimal lateral view. Electronically Signed   By: Abigail Miyamoto M.D.   On: July 05, 2015 10:04   Ct Head Wo Contrast  06/18/2015  CLINICAL DATA:  Recent onset confusion/altered mental status. History of tonsil carcinoma EXAM: CT HEAD WITHOUT CONTRAST TECHNIQUE: Contiguous axial images were obtained from the base of the  skull through the vertex without intravenous contrast. COMPARISON:  None. FINDINGS: The ventricles are normal in size and configuration. The left lateral ventricle is slightly larger than the right lateral ventricle, an anatomic variant. There is no intracranial mass, hemorrhage, extra-axial fluid collection, or midline shift. Gray-white compartments appear normal. No acute infarct evident. Bony calvarium appears intact. The mastoid air cells are clear. No intraorbital lesions are evident. IMPRESSION: Study within normal limits. Electronically Signed   By: Lowella Grip III M.D.   On: 06/18/2015 12:54   Dg Chest Port 1 View  06/18/2015  CLINICAL DATA:  Confusion and cough EXAM: PORTABLE CHEST 1 VIEW COMPARISON:  03/02/2014 FINDINGS: Cardiac shadow is within normal limits. The right lung is clear. Increased infiltrate is noted in the left upper lobe. No sizable effusion is seen. No bony abnormality is noted. IMPRESSION: Left upper lobe infiltrate. Followup PA and lateral chest X-ray is recommended in 3-4 weeks following trial of antibiotic therapy to ensure resolution and exclude underlying malignancy. Electronically Signed   By: Inez Catalina M.D.   On: 06/18/2015 14:25    Lab Results: Basic Metabolic Panel:  Recent Labs  07/05/15 0551 06/25/15 0640  NA 131* 133*  K 3.8 3.4*  CL 102 103  CO2 23 25  GLUCOSE 80 92  BUN 7 8  CREATININE 0.52* 0.50*  CALCIUM 7.7* 7.8*   Liver Function Tests:  Recent Labs  06/25/15 0640  AST 137*  ALT 59  ALKPHOS 175*  BILITOT 1.3*  PROT 7.0  ALBUMIN 1.6*     CBC:  Recent Labs  06/25/15 0640  WBC 4.0  HGB 8.6*  HCT 25.6*  MCV 78.8  PLT 97*    Recent Results (from the past 240 hour(s))  Culture, blood (Routine X 2) w Reflex to ID Panel     Status: None   Collection Time: 06/18/15  5:10 PM  Result Value Ref Range Status   Specimen Description BLOOD RIGHT HAND  Final   Special Requests BOTTLES DRAWN AEROBIC ONLY 4CC  Final   Culture NO  GROWTH 5 DAYS  Final   Report Status 2015/07/05 FINAL  Final  Culture, blood (Routine X 2) w Reflex to ID Panel     Status: None   Collection Time: 06/18/15  5:17 PM  Result Value Ref Range Status   Specimen Description BLOOD LEFT HAND  Final   Special Requests BOTTLES DRAWN AEROBIC ONLY 5CC  Final   Culture NO GROWTH 5 DAYS  Final   Report Status 07-05-2015 FINAL  Final  Hospital Course:   This is a 60 years old male patient with history of multiple medical illnesses including advanced hepatocellular carcinoma was admitted due to health care associated pneumonia and hepatic encephalopathy. Patient was started on combinations of iv antibiotics and was also evaluated by GI. Over the hospital stay he gradually improved and is back to his baseline. Patient will discharged with oral antibiotics.  Discharge Exam: Blood pressure 122/74, pulse 82, temperature 97.9 F (36.6 C), temperature source Oral, resp. rate 19, height 5\' 8"  (1.727 m), weight 76.204 kg (168 lb), SpO2 100 %.    Disposition:  home        Follow-up Information    Follow up with [facility], MD In 2 weeks.   Specialty:  Internal Medicine   Contact information:   [place] [address] [phone]       Signed: Rosita Fire   06/25/2015, 8:23 AM

## 2015-06-25 NOTE — Progress Notes (Signed)
Pt discharged via wheelchair into the care of family via private vehicle in stable condition.  Discharge instructions reviewed with pt with no questions verbalized.  Pt verbalized understanding.  Prescriptions provided to pt.

## 2015-06-25 NOTE — Care Management Note (Addendum)
Case Management Note  Patient Details  Name: [name] MRN: [id number] Date of Birth: 07/06/1955   Expected Discharge Date:       06/25/2015           Expected Discharge Plan:  [place]  In-House Referral:  Clinical Social Work  Discharge planning Services  CM Consult  Post Acute Care Choice:    Choice offered to:  Sibling  DME Arranged:  N/A DME Agency:     HH Arranged:  RN, PT [place] Agency:  [facility]  Status of Service:     Medicare Important Message Given:  Yes Date Medicare IM Given:    Medicare IM give by:    Date Additional Medicare IM Given:    Additional Medicare Important Message give by:     If discussed at [place] of Stay Meetings, dates discussed:    Additional Comments: Called [facility], patient is active with them until June 24th with [facility] RN and [facility] PT. Will resume services at discharge. Resumption orders faxed to [facility]. [name] is aware that [facility] has 48 hours to make a visit.   [place], Chauncey Reading, RN 06/25/2015, 11:32 AM

## 2015-09-03 ENCOUNTER — Emergency Department (HOSPITAL_COMMUNITY)

## 2015-09-03 ENCOUNTER — Inpatient Hospital Stay (HOSPITAL_COMMUNITY)
Admission: EM | Admit: 2015-09-03 | Discharge: 2015-09-04 | DRG: 442 | Disposition: A | Discharge status: Hospice | Attending: Internal Medicine | Admitting: Internal Medicine

## 2015-09-03 ENCOUNTER — Encounter (HOSPITAL_COMMUNITY): Payer: Self-pay

## 2015-09-03 DIAGNOSIS — K7290 Hepatic failure, unspecified without coma: Principal | ICD-10-CM | POA: Diagnosis present

## 2015-09-03 DIAGNOSIS — Z923 Personal history of irradiation: Secondary | ICD-10-CM

## 2015-09-03 DIAGNOSIS — R410 Disorientation, unspecified: Secondary | ICD-10-CM

## 2015-09-03 DIAGNOSIS — Z66 Do not resuscitate: Secondary | ICD-10-CM | POA: Diagnosis present

## 2015-09-03 DIAGNOSIS — K219 Gastro-esophageal reflux disease without esophagitis: Secondary | ICD-10-CM | POA: Diagnosis present

## 2015-09-03 DIAGNOSIS — G9340 Encephalopathy, unspecified: Secondary | ICD-10-CM | POA: Diagnosis present

## 2015-09-03 DIAGNOSIS — E785 Hyperlipidemia, unspecified: Secondary | ICD-10-CM | POA: Diagnosis present

## 2015-09-03 DIAGNOSIS — R4182 Altered mental status, unspecified: Secondary | ICD-10-CM | POA: Diagnosis present

## 2015-09-03 DIAGNOSIS — G9389 Other specified disorders of brain: Secondary | ICD-10-CM

## 2015-09-03 DIAGNOSIS — Z515 Encounter for palliative care: Secondary | ICD-10-CM | POA: Diagnosis present

## 2015-09-03 DIAGNOSIS — C220 Liver cell carcinoma: Secondary | ICD-10-CM | POA: Diagnosis present

## 2015-09-03 DIAGNOSIS — E871 Hypo-osmolality and hyponatremia: Secondary | ICD-10-CM | POA: Diagnosis present

## 2015-09-03 DIAGNOSIS — J45909 Unspecified asthma, uncomplicated: Secondary | ICD-10-CM | POA: Diagnosis present

## 2015-09-03 DIAGNOSIS — F1010 Alcohol abuse, uncomplicated: Secondary | ICD-10-CM | POA: Diagnosis present

## 2015-09-03 DIAGNOSIS — Z85818 Personal history of malignant neoplasm of other sites of lip, oral cavity, and pharynx: Secondary | ICD-10-CM

## 2015-09-03 DIAGNOSIS — K7682 Hepatic encephalopathy: Secondary | ICD-10-CM | POA: Diagnosis present

## 2015-09-03 DIAGNOSIS — I10 Essential (primary) hypertension: Secondary | ICD-10-CM | POA: Diagnosis present

## 2015-09-03 DIAGNOSIS — Z87891 Personal history of nicotine dependence: Secondary | ICD-10-CM

## 2015-09-03 LAB — BASIC METABOLIC PANEL
Anion gap: 6 (ref 5–15)
BUN: 9 mg/dL (ref 6–20)
CALCIUM: 7.8 mg/dL — AB (ref 8.9–10.3)
CO2: 22 mmol/L (ref 22–32)
CREATININE: 0.95 mg/dL (ref 0.61–1.24)
Chloride: 100 mmol/L — ABNORMAL LOW (ref 101–111)
Glucose, Bld: 131 mg/dL — ABNORMAL HIGH (ref 65–99)
Potassium: 3.9 mmol/L (ref 3.5–5.1)
SODIUM: 128 mmol/L — AB (ref 135–145)

## 2015-09-03 LAB — CBC WITH DIFFERENTIAL/PLATELET
BASOS PCT: 1 %
Basophils Absolute: 0 10*3/uL (ref 0.0–0.1)
EOS ABS: 0.2 10*3/uL (ref 0.0–0.7)
Eosinophils Relative: 3 %
HCT: 28.6 % — ABNORMAL LOW (ref 39.0–52.0)
HEMOGLOBIN: 9.6 g/dL — AB (ref 13.0–17.0)
Lymphocytes Relative: 11 %
Lymphs Abs: 0.8 10*3/uL (ref 0.7–4.0)
MCH: 27 pg (ref 26.0–34.0)
MCHC: 33.6 g/dL (ref 30.0–36.0)
MCV: 80.3 fL (ref 78.0–100.0)
MONOS PCT: 13 %
Monocytes Absolute: 0.9 10*3/uL (ref 0.1–1.0)
NEUTROS PCT: 73 %
Neutro Abs: 5.3 10*3/uL (ref 1.7–7.7)
Platelets: 152 10*3/uL (ref 150–400)
RBC: 3.56 MIL/uL — AB (ref 4.22–5.81)
RDW: 20.3 % — ABNORMAL HIGH (ref 11.5–15.5)
WBC: 7.2 10*3/uL (ref 4.0–10.5)

## 2015-09-03 LAB — HEPATIC FUNCTION PANEL
ALBUMIN: 1.6 g/dL — AB (ref 3.5–5.0)
ALT: 97 U/L — ABNORMAL HIGH (ref 17–63)
AST: 202 U/L — ABNORMAL HIGH (ref 15–41)
Alkaline Phosphatase: 351 U/L — ABNORMAL HIGH (ref 38–126)
BILIRUBIN TOTAL: 3.3 mg/dL — AB (ref 0.3–1.2)
Bilirubin, Direct: 1.7 mg/dL — ABNORMAL HIGH (ref 0.1–0.5)
Indirect Bilirubin: 1.6 mg/dL — ABNORMAL HIGH (ref 0.3–0.9)
Total Protein: 8 g/dL (ref 6.5–8.1)

## 2015-09-03 LAB — PROTIME-INR
INR: 1.6
PROTHROMBIN TIME: 19.2 s — AB (ref 11.4–15.2)

## 2015-09-03 LAB — AMMONIA: Ammonia: 45 umol/L — ABNORMAL HIGH (ref 9–35)

## 2015-09-03 MED ORDER — AMLODIPINE BESYLATE 5 MG PO TABS
5.0000 mg | ORAL_TABLET | Freq: Every day | ORAL | Status: DC
  Administered 2015-09-04: 5 mg via ORAL
  Filled 2015-09-03: qty 1

## 2015-09-03 MED ORDER — ATORVASTATIN CALCIUM 10 MG PO TABS
10.0000 mg | ORAL_TABLET | Freq: Every evening | ORAL | Status: DC
  Filled 2015-09-03: qty 1

## 2015-09-03 MED ORDER — FUROSEMIDE 40 MG PO TABS
40.0000 mg | ORAL_TABLET | Freq: Every day | ORAL | Status: DC
  Administered 2015-09-04: 40 mg via ORAL
  Filled 2015-09-03: qty 1

## 2015-09-03 MED ORDER — VITAMIN B-1 100 MG PO TABS
100.0000 mg | ORAL_TABLET | Freq: Every day | ORAL | Status: DC
  Administered 2015-09-04: 100 mg via ORAL
  Filled 2015-09-03: qty 1

## 2015-09-03 MED ORDER — SODIUM CHLORIDE 0.9% FLUSH
3.0000 mL | Freq: Two times a day (BID) | INTRAVENOUS | Status: DC
  Administered 2015-09-04: 3 mL via INTRAVENOUS

## 2015-09-03 MED ORDER — CIPROFLOXACIN HCL 250 MG PO TABS
500.0000 mg | ORAL_TABLET | Freq: Two times a day (BID) | ORAL | Status: DC
  Administered 2015-09-04 (×2): 500 mg via ORAL
  Filled 2015-09-03 (×2): qty 2

## 2015-09-03 MED ORDER — PANTOPRAZOLE SODIUM 40 MG PO TBEC
40.0000 mg | DELAYED_RELEASE_TABLET | Freq: Every day | ORAL | Status: DC
  Administered 2015-09-04: 40 mg via ORAL
  Filled 2015-09-03: qty 1

## 2015-09-03 MED ORDER — SPIRONOLACTONE 25 MG PO TABS
25.0000 mg | ORAL_TABLET | Freq: Two times a day (BID) | ORAL | Status: DC
  Administered 2015-09-04: 25 mg via ORAL
  Filled 2015-09-03 (×4): qty 1

## 2015-09-03 MED ORDER — LACTULOSE 10 GM/15ML PO SOLN
10.0000 g | Freq: Two times a day (BID) | ORAL | Status: DC
  Administered 2015-09-04 (×2): 10 g via ORAL
  Filled 2015-09-03 (×2): qty 30

## 2015-09-03 MED ORDER — REGORAFENIB 40 MG PO TABS: 120.0000 mg | ORAL_TABLET | ORAL | Status: DC

## 2015-09-03 MED ORDER — RIFAXIMIN 550 MG PO TABS
550.0000 mg | ORAL_TABLET | Freq: Two times a day (BID) | ORAL | Status: DC
  Administered 2015-09-04 (×2): 550 mg via ORAL
  Filled 2015-09-03 (×5): qty 1

## 2015-09-03 MED ORDER — ALBUTEROL SULFATE HFA 108 (90 BASE) MCG/ACT IN AERS: 2.0000 | INHALATION_SPRAY | Freq: Four times a day (QID) | RESPIRATORY_TRACT | Status: DC

## 2015-09-03 NOTE — ED Notes (Signed)
Attempted IV access x2 unsuccessful 

## 2015-09-03 NOTE — ED Notes (Signed)
Patient's family member at bedside, states "He's got cancer and I think he's on hospice. We've been trying to see him but he just comes to the door and tells [place] he's ok. He lets his nurse in the house but I don't know if he lets anyone else in or not."

## 2015-09-03 NOTE — ED Notes (Signed)
2 IV access attempts- unsuccessful.
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
## 2015-09-03 NOTE — ED Notes (Signed)
C-collar removed from patient per Dr Marin Comment approval. Patient lying in bed sleeping at this time.

## 2015-09-04 ENCOUNTER — Inpatient Hospital Stay (HOSPITAL_COMMUNITY)

## 2015-09-04 ENCOUNTER — Encounter (HOSPITAL_COMMUNITY): Payer: Self-pay | Admitting: *Deleted

## 2015-09-04 DIAGNOSIS — E871 Hypo-osmolality and hyponatremia: Secondary | ICD-10-CM | POA: Diagnosis present

## 2015-09-04 DIAGNOSIS — I10 Essential (primary) hypertension: Secondary | ICD-10-CM | POA: Diagnosis present

## 2015-09-04 DIAGNOSIS — Z515 Encounter for palliative care: Secondary | ICD-10-CM | POA: Diagnosis present

## 2015-09-04 DIAGNOSIS — Z85818 Personal history of malignant neoplasm of other sites of lip, oral cavity, and pharynx: Secondary | ICD-10-CM | POA: Diagnosis not present

## 2015-09-04 DIAGNOSIS — K219 Gastro-esophageal reflux disease without esophagitis: Secondary | ICD-10-CM | POA: Diagnosis present

## 2015-09-04 DIAGNOSIS — F1010 Alcohol abuse, uncomplicated: Secondary | ICD-10-CM | POA: Diagnosis present

## 2015-09-04 DIAGNOSIS — J45909 Unspecified asthma, uncomplicated: Secondary | ICD-10-CM | POA: Diagnosis present

## 2015-09-04 DIAGNOSIS — E785 Hyperlipidemia, unspecified: Secondary | ICD-10-CM | POA: Diagnosis present

## 2015-09-04 DIAGNOSIS — Z87891 Personal history of nicotine dependence: Secondary | ICD-10-CM | POA: Diagnosis not present

## 2015-09-04 DIAGNOSIS — C220 Liver cell carcinoma: Secondary | ICD-10-CM | POA: Diagnosis present

## 2015-09-04 DIAGNOSIS — K7290 Hepatic failure, unspecified without coma: Secondary | ICD-10-CM | POA: Diagnosis present

## 2015-09-04 DIAGNOSIS — R410 Disorientation, unspecified: Secondary | ICD-10-CM | POA: Diagnosis present

## 2015-09-04 DIAGNOSIS — Z66 Do not resuscitate: Secondary | ICD-10-CM | POA: Diagnosis present

## 2015-09-04 DIAGNOSIS — Z923 Personal history of irradiation: Secondary | ICD-10-CM | POA: Diagnosis not present

## 2015-09-04 LAB — TSH: TSH: 8.185 u[IU]/mL — ABNORMAL HIGH (ref 0.350–4.500)

## 2015-09-04 MED ORDER — CETYLPYRIDINIUM CHLORIDE 0.05 % MT LIQD
7.0000 mL | Freq: Two times a day (BID) | OROMUCOSAL | Status: DC
  Administered 2015-09-04: 7 mL via OROMUCOSAL

## 2015-09-04 MED ORDER — RIFAXIMIN 550 MG PO TABS
ORAL_TABLET | ORAL | Status: AC
  Filled 2015-09-04: qty 1

## 2015-09-04 MED ORDER — ALBUTEROL SULFATE (2.5 MG/3ML) 0.083% IN NEBU
3.0000 mL | INHALATION_SOLUTION | Freq: Four times a day (QID) | RESPIRATORY_TRACT | Status: DC
  Administered 2015-09-04: 3 mL via RESPIRATORY_TRACT
  Filled 2015-09-04: qty 3
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
## 2015-09-04 NOTE — Clinical Social Work Note (Signed)
Pt receiving home hospice services through [facility]. Otila Kluver, RN with hospice discussed options with pt/family and they have chosen to transfer to [facility]. CSW confirmed this with pt and his brothers. MD aware and agreeable. Hospice will arrange transport this afternoon.  Benay Pike, [place]
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
## 2015-09-04 NOTE — Progress Notes (Signed)
Report given to Etheleen Mayhew RN, [facility] called and informed of order to discharge patient.
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
## 2015-09-04 NOTE — Progress Notes (Signed)
Transferred from the unit via stretcher accompanied by EMS.

## 2015-10-19 DEATH — deceased

## 2016-02-24 IMAGING — CR DG CHEST 2V
2 series · 2 of 2 positions shown · non-contrast
Comparison: 02/11/2011

CLINICAL DATA: Weight loss and chest tightness. History of
pharyngeal cancer 6 years.

EXAM:
CHEST  2 VIEW

[view not recorded (1 of 2)]
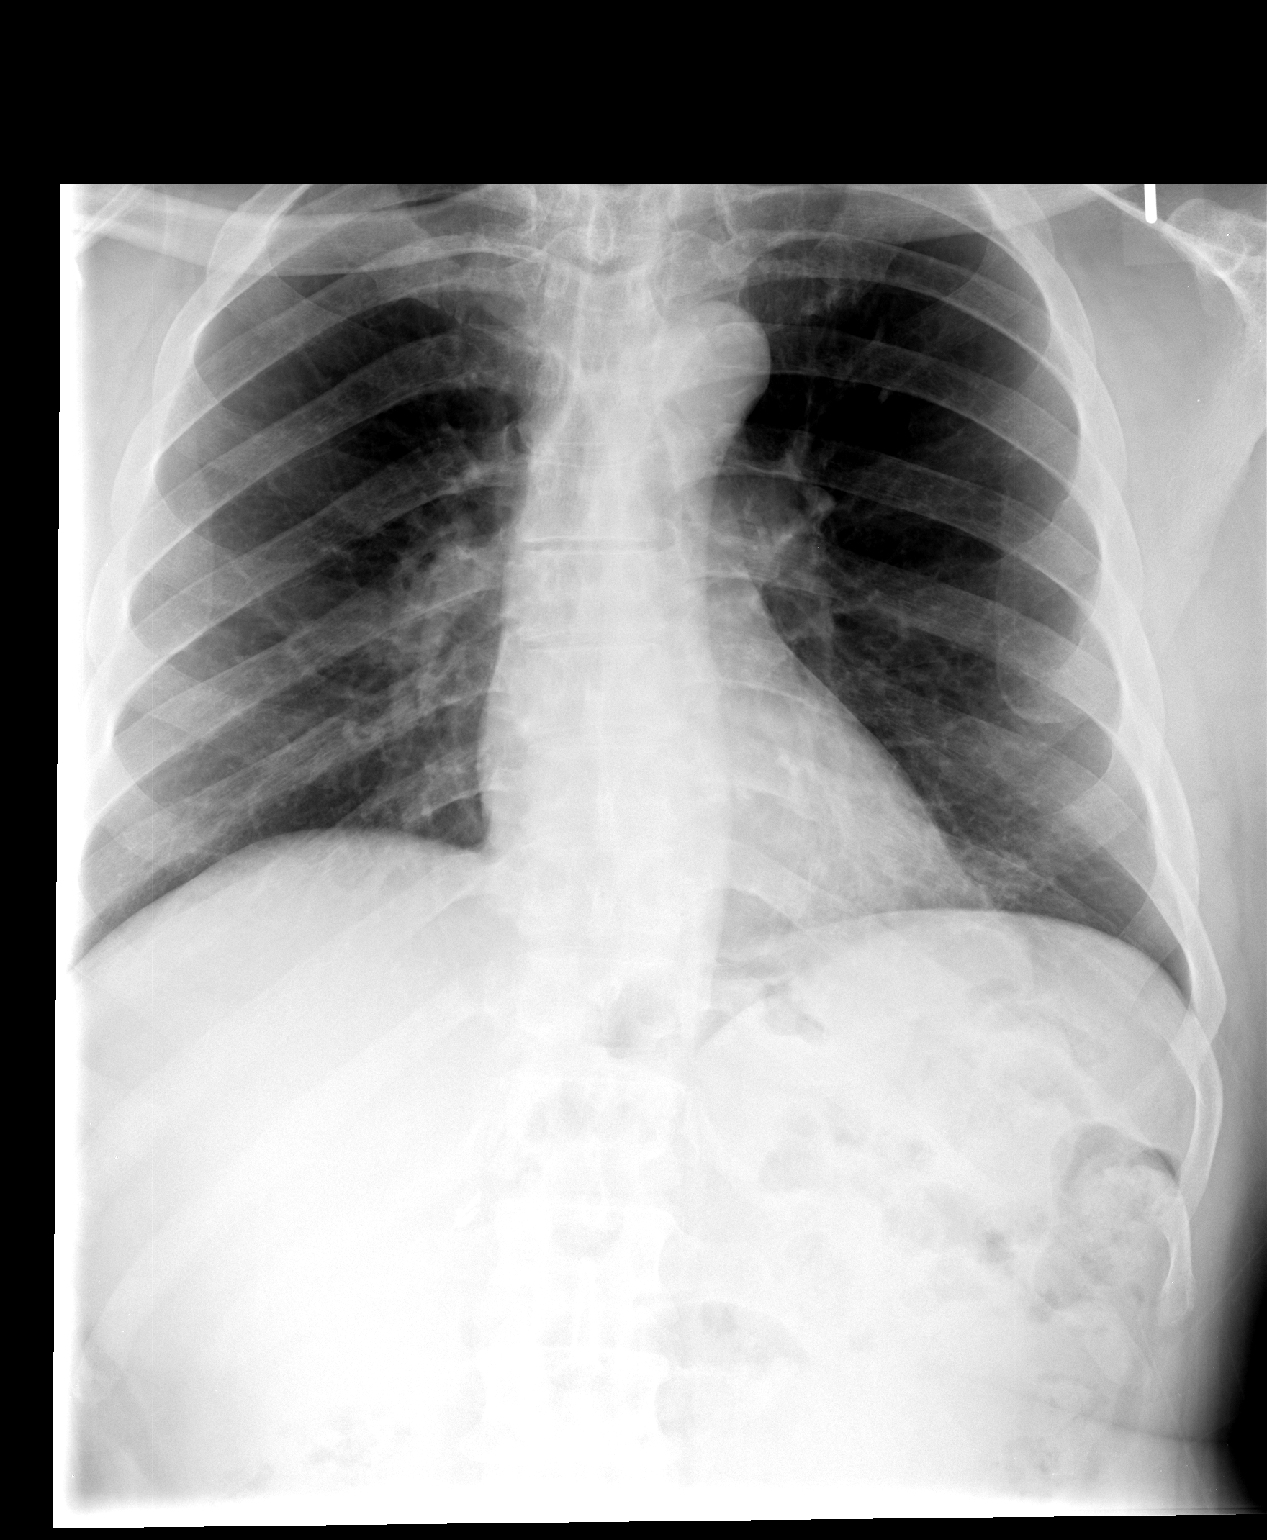

[view not recorded (2 of 2)]
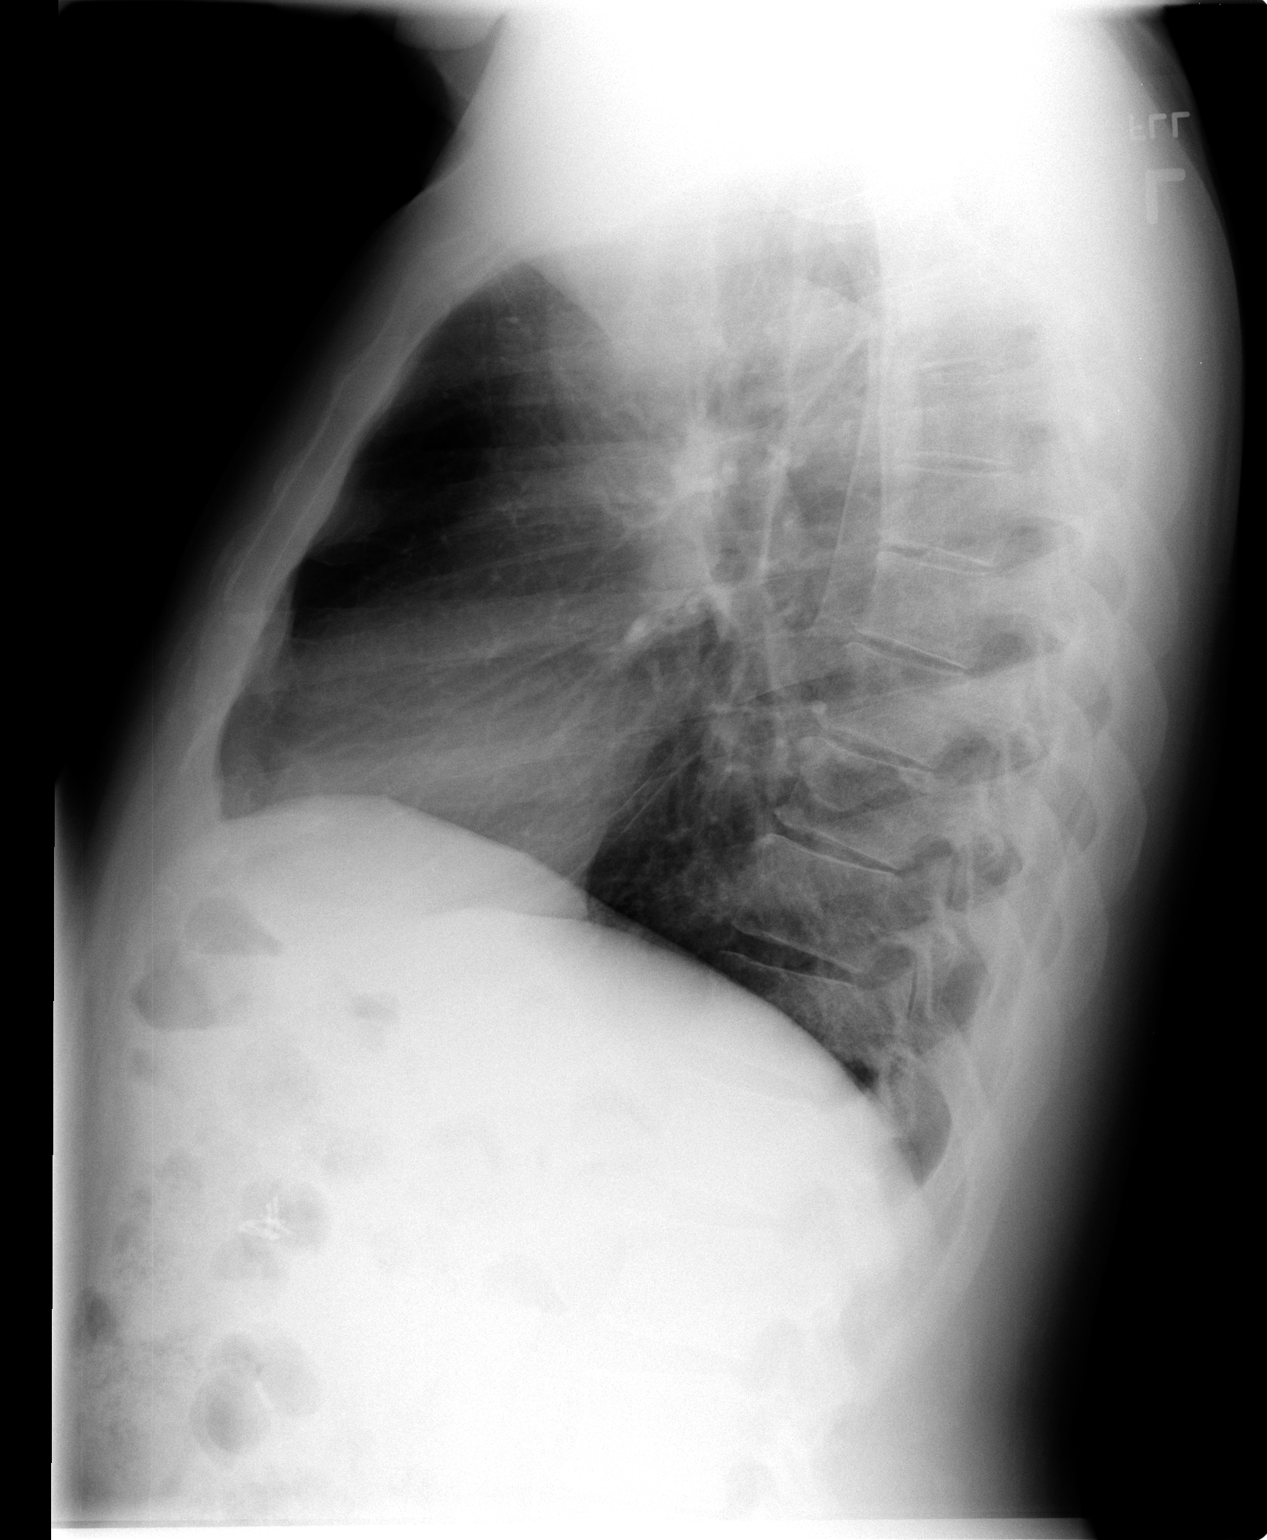

[2 of 2 positions shown; findings below may reference images not displayed]

FINDINGS: Lungs are adequately inflated without consolidation or effusion.
Cardiomediastinal silhouette and remainder of the exam is unchanged.
IMPRESSION: No active cardiopulmonary disease.
